# Patient Record
Sex: Female | Born: 1940 | Race: White | Hispanic: Yes | Marital: Married | Smoking: Never smoker
Health system: Southern US, Community
[De-identification: ages and names within clinical notes are randomized; demographics above are authoritative.]

## PROBLEM LIST (undated history)

## (undated) DIAGNOSIS — K219 Gastro-esophageal reflux disease without esophagitis: Secondary | ICD-10-CM

## (undated) DIAGNOSIS — I10 Essential (primary) hypertension: Secondary | ICD-10-CM

## (undated) DIAGNOSIS — M1990 Unspecified osteoarthritis, unspecified site: Secondary | ICD-10-CM

## (undated) HISTORY — PX: JOINT REPLACEMENT: SHX530

## (undated) HISTORY — PX: CHOLECYSTECTOMY: SHX55

## (undated) HISTORY — PX: TONSILLECTOMY: SUR1361

## (undated) HISTORY — PX: TRIGGER FINGER RELEASE: SHX641

## (undated) HISTORY — PX: WRIST FRACTURE SURGERY: SHX121

## (undated) HISTORY — PX: APPENDECTOMY: SHX54

## (undated) HISTORY — PX: ANKLE FRACTURE SURGERY: SHX122

## (undated) HISTORY — PX: EYE SURGERY: SHX253

## (undated) HISTORY — PX: OTHER SURGICAL HISTORY: SHX169

---

## 2006-08-13 ENCOUNTER — Emergency Department: Payer: Self-pay | Admitting: Emergency Medicine

## 2006-08-16 ENCOUNTER — Ambulatory Visit: Payer: Self-pay | Admitting: General Practice

## 2006-10-25 ENCOUNTER — Encounter: Payer: Self-pay | Admitting: General Practice

## 2006-11-08 ENCOUNTER — Encounter: Payer: Self-pay | Admitting: General Practice

## 2011-10-05 ENCOUNTER — Ambulatory Visit: Payer: Self-pay

## 2011-10-18 ENCOUNTER — Ambulatory Visit: Payer: Self-pay | Admitting: General Practice

## 2011-12-06 ENCOUNTER — Ambulatory Visit: Payer: Self-pay | Admitting: General Practice

## 2011-12-06 DIAGNOSIS — Z01810 Encounter for preprocedural cardiovascular examination: Secondary | ICD-10-CM

## 2011-12-06 LAB — URINALYSIS, COMPLETE
Bacteria: NONE SEEN
Blood: NEGATIVE
Leukocyte Esterase: NEGATIVE
Ph: 7 (ref 4.5–8.0)
Protein: NEGATIVE
Squamous Epithelial: NONE SEEN

## 2011-12-06 LAB — CBC
MCH: 32.2 pg (ref 26.0–34.0)
MCHC: 33.9 g/dL (ref 32.0–36.0)
MCV: 95 fL (ref 80–100)
Platelet: 276 10*3/uL (ref 150–440)
RBC: 4.24 10*6/uL (ref 3.80–5.20)

## 2011-12-06 LAB — PROTIME-INR: INR: 1

## 2011-12-06 LAB — BASIC METABOLIC PANEL
Anion Gap: 6 — ABNORMAL LOW (ref 7–16)
BUN: 22 mg/dL — ABNORMAL HIGH (ref 7–18)
Calcium, Total: 9.4 mg/dL (ref 8.5–10.1)
Chloride: 104 mmol/L (ref 98–107)
Co2: 32 mmol/L (ref 21–32)
Creatinine: 0.75 mg/dL (ref 0.60–1.30)
EGFR (African American): >60
Glucose: 102 mg/dL — ABNORMAL HIGH (ref 65–99)
Osmolality: 287 (ref 275–301)
Potassium: 4.5 mmol/L (ref 3.5–5.1)

## 2011-12-06 LAB — MRSA PCR SCREENING

## 2011-12-06 LAB — APTT: Activated PTT: 28.5 secs (ref 23.6–35.9)

## 2011-12-13 ENCOUNTER — Ambulatory Visit: Payer: Self-pay | Admitting: General Practice

## 2011-12-20 ENCOUNTER — Inpatient Hospital Stay: Payer: Self-pay | Admitting: General Practice

## 2011-12-21 LAB — BASIC METABOLIC PANEL
Anion Gap: 7 (ref 7–16)
Calcium, Total: 8.3 mg/dL — ABNORMAL LOW (ref 8.5–10.1)
Chloride: 104 mmol/L (ref 98–107)
Co2: 30 mmol/L (ref 21–32)
Creatinine: 0.69 mg/dL (ref 0.60–1.30)
EGFR (African American): >60
EGFR (Non-African Amer.): >60
Glucose: 101 mg/dL — ABNORMAL HIGH (ref 65–99)
Osmolality: 282 (ref 275–301)
Sodium: 141 mmol/L (ref 136–145)

## 2011-12-21 LAB — PLATELET COUNT: Platelet: 182 10*3/uL (ref 150–440)

## 2011-12-21 LAB — HEMOGLOBIN: HGB: 11.4 g/dL — ABNORMAL LOW (ref 12.0–16.0)

## 2011-12-22 LAB — BASIC METABOLIC PANEL
Anion Gap: 8 (ref 7–16)
BUN: 11 mg/dL (ref 7–18)
Calcium, Total: 8.6 mg/dL (ref 8.5–10.1)
Chloride: 102 mmol/L (ref 98–107)
Co2: 30 mmol/L (ref 21–32)
EGFR (African American): >60
EGFR (Non-African Amer.): >60
Glucose: 94 mg/dL (ref 65–99)
Osmolality: 279 (ref 275–301)
Potassium: 3.9 mmol/L (ref 3.5–5.1)

## 2011-12-22 LAB — HEMOGLOBIN: HGB: 11.1 g/dL — ABNORMAL LOW (ref 12.0–16.0)

## 2011-12-22 LAB — PLATELET COUNT: Platelet: 188 10*3/uL (ref 150–440)

## 2015-03-02 NOTE — Op Note (Signed)
PATIENT NAME:  Anne [name], Anne [name] MR#:  [id number] DATE OF BIRTH:  [birth year]  DATE OF PROCEDURE:  12/20/2011  PREOPERATIVE DIAGNOSIS: Degenerative arthrosis of the right knee.   POSTOPERATIVE DIAGNOSIS: Degenerative arthrosis of the right knee.   PROCEDURE PERFORMED: Right total knee arthroplasty using computer-assisted navigation.   SURGEON: Illene LabradorJames P. Angie FavaHooten Jr., MD  ASSISTANT: Van ClinesJon Wolfe, PA-C (required to maintain retraction throughout the procedure)   ANESTHESIA: Femoral nerve block and spinal.   ESTIMATED BLOOD LOSS: 50 mL.   FLUIDS REPLACED: 1300 mL of crystalloid.   TOURNIQUET TIME: 94 minutes.   DRAINS: Two medium drains to reinfusion system.   SOFT TISSUE RELEASES: Anterior cruciate ligament, posterior cruciate ligament, deep medial collateral ligament, and patellofemoral ligament.   IMPLANTS UTILIZED: DePuy PFC Sigma size 3 posterior stabilized femoral component (cemented), size 3 MBT tibial component (cemented), 32 mm three peg oval dome patella (cemented), and [name] 17.5 mm stabilized rotating platform polyethylene insert. Gentamicin cement was used due to patient's previous surgery to the right knee.   INDICATIONS FOR SURGERY: The patient is [name] 74 year old female who has been seen for complaints of progressive right knee pain. Patient has remote history of right proximal tibial osteotomy for degenerative arthrosis. She underwent staged procedure earlier with removal of the plate and screws. After adequate healing of that site, plans were made for right total knee arthroplasty. Risks and benefits of surgical intervention were discussed in detail with patient. She expressed her understanding of the risks and benefits and agreed with plans for surgical intervention.   PROCEDURE IN DETAIL: Patient was brought into the Operating Room and, after adequate femoral nerve block and spinal anesthesia was achieved, [name] tourniquet was placed on patient's upper right thigh. Patient's right knee and  leg were cleaned and prepped with alcohol and DuraPrep, draped in the usual sterile fashion. [name] "timeout" was performed as per usual protocol. The right lower extremity was exsanguinated using Esmarch, and the tourniquet was inflated to 300 mmHg. An anterior longitudinal incision was made incorporating the previous surgical incision. Standard mid vastus approach was utilized. The deep fibers of the medial collateral ligament were elevated in subperiosteal fashion off the medial flare of the tibia so as to maintain [name] continuous soft tissue sleeve. Patella was subluxed laterally and the patellofemoral ligament was incised. Inspection of the knee demonstrated severe degenerative changes most notably to the medial compartment. Prominent osteophytes were debrided using rongeur. Anterior and posterior cruciate ligaments were excised. Two 4.0 mm Schanz pins were inserted into the femur and into the tibia for attachment of the ray of spheres used for computer-assisted navigation. Hip center was identified using circumduction technique. Distal landmarks were mapped using computer. Distal femur and proximal tibia were mapped using the computer. Distal femoral cutting guide was positioned using computer-assisted navigation so as to achieve [name] 5 degrees distal valgus cut. Cut was performed and verified using computer. Distal femur was sized and it was felt that [name] size 3 femoral component was appropriate. Size 3 cutting guide was positioned using computer-assisted navigation and the anterior cut was performed and verified using the computer. This was followed by completion of the posterior and chamfer cuts. The femoral cutting guide for central box was then positioned and central box cut was performed.   Attention was then directed to the proximal tibia. Medial and lateral menisci were excised. The extramedullary tibial cutting guide was positioned using computer-assisted navigation so as to achieve 0 degrees varus valgus  alignment and 0 degrees posterior  slope. Cut was performed and verified using the computer. Proximal tibia was sized and it was felt that [name] size 3 tibial tray was appropriate. Tibial and femoral trials were inserted followed by insertion of first [name] 12, subsequently [name] 15 and eventually [name] 17.5 mm polyethylene trial. This allowed for good medial and lateral soft tissue balancing both in full extension and in 90 degrees of flexion. Finally, the patella was cut and prepared so as to accommodate [name] 32 mm three peg oval dome patella. Patellar trial was placed and the knee was placed through range of motion with excellent patellar tracking appreciated.   Femoral trial was removed. Central post hole for the tibial component was reamed followed by insertion of [name] keel punch. Tibial trial was then removed. Cut surfaces of bone were irrigated with copious amounts of normal saline with antibiotic solution using pulsatile lavage and then suctioned dry. Polymethyl methacrylate cement with gentamicin was prepared in the usual fashion using [name] vacuum mixer. Cement was applied to the cut surface of the proximal tibia as well as along the undersurface of [name] size 3 MBT tibial component. Tibial component was positioned and impacted into place. Excess cement was removed using freer elevators. Cement was then applied to the cut surface of the femur as well as along the posterior flanges of [name] size 3 posterior stabilized femoral component. Femoral component was positioned and impacted into place. Excess cement was removed using freer elevators. [name] 17.5 mm polyethylene trial was inserted and the knee was brought into full extension with steady axial compression applied. Finally, cement was applied to the backside of [name] 32 mm three peg oval dome patella and the patellar component was positioned and patellar clamp applied. Excess cement was removed using freer elevators.   After adequate curing of cement, tourniquet was deflated after total  tourniquet time of 94 minutes. Hemostasis was achieved using electrocautery. The knee was irrigated with copious amounts of normal saline with antibiotic solution using pulsatile lavage and then suctioned dry. The knee was inspected for any residual cement debris. 30 mL of 0.25% Marcaine with epinephrine was injected along the posterior capsule. [name] 17.5 mm stabilized rotating platform polyethylene insert was inserted and the knee was placed through [name] range of motion. Excellent mediolateral soft tissue balancing was appreciated both in full extension and in 30 degrees of flexion.   Two medium drains were placed in the wound bed and brought out through [name] separate stab incision to be attached to reinfusion system. Medial parapatellar portion of incision was reapproximated using interrupted sutures of #1 Vicryl. Subcutaneous tissue was approximated in layers using first #0 Vicryl followed by 2-0 Vicryl. Skin was closed with skin staples. Sterile dressing was applied.   Patient tolerated procedure well. She was transported to the recovery room in stable condition.   ____________________________ Illene Labrador. Angie Fava., MD jph:cms D: 12/20/2011 16:26:02 ET T: 12/20/2011 17:34:21 ET JOB#: [id number]  cc: Fayrene Fearing P. Angie Fava., MD, <Dictator> JAMES P Angie Fava MD ELECTRONICALLY SIGNED 12/20/2011 23:01

## 2015-03-02 NOTE — Discharge Summary (Signed)
PATIENT NAME:  Anne Anne [name], Anne Anne [name] MR#:  [id number] DATE OF BIRTH:  1941-03-17  DATE OF ADMISSION:  12/20/2011 DATE OF DISCHARGE:  12/23/2011  ADMITTING DIAGNOSIS: Degenerative arthrosis of the right knee.   DISCHARGE DIAGNOSES: Degenerative arthrosis of the right knee.   HISTORY: The patient is Anne [name] pleasant 74 year old female who has been [facility] for progression of right knee pain. She has had Anne [name] remote history of arthroscopy as well as Anne [name] right proximal tibial osteotomy performed in 2003. She has had progressive right knee pain over the course of the last several years. Her pain was noted to be aggravated with weight-bearing activities. She had reported some near giving way of the knee as well as some mild swelling. She had recently underwent removal of the hardware from the right knee in preparation for total knee arthroplasty. X-rays taken prior to that showed significant narrowing of the medial cartilage space with subchondral sclerosis. She was noted to have anterior slope of the tibia noted on the lateral view. Degenerative changes were also noted to the patellofemoral articulation. After discussion of the risks and benefits of surgical intervention, the patient expressed her understanding of the risks and benefits and agreed with plans for surgical intervention.   PROCEDURE: Right total knee arthroplasty using computer-assisted navigation.   ANESTHESIA: Femoral nerve block with spinal.   SOFT TISSUE RELEASE: Anterior cruciate ligament, posterior cruciate ligament, and deep medial collateral ligaments, as well as the patellofemoral ligament.   IMPLANTS UTILIZED: DePuy PFC Sigma size 3 posterior stabilized femoral component (cemented), size 3 MBT tibial component (cemented), 32 mm three pegged oval dome patella (cemented), and Anne [name] [name]   HOSPITAL  COURSE: The patient tolerated the procedure very well. She had no complications. She was then taken to the PAC-U where she was stabilized and then transferred to the orthopedic floor. The patient began receiving anticoagulation therapy of Lovenox 30 mg subcutaneous every 12 hours per anesthesia protocol. TED stockings were applied bilaterally. These were allowed to be removed one hour per eight hour shift. The right one was applied on day two following removal of the Hemovac and dressing change. The patient was also fitted with the AV-I compression foot pumps bilaterally set at 130 mmHg. Her calves have been nontender and free of any evidence of any deep venous thromboses. Negative Homans sign. Heels were elevated off the bed using rolled towels. The heels have been nontender and no tissue breakdown was noted.  The patient has denied any chest pain or shortness of breath. Vital signs have been stable. She has been afebrile. Hemodynamically she was stable. No transfusions were given other than the Autovac given the first six hours postoperatively.   Physical therapy was initiated on day one for gait training and transfers. She has done extremely well. She was ambulating greater than 200 feet upon discharge and was able to get up and down four sets of steps. She was independent with bed to chair transfers. Occupational therapy was also initiated for activities of daily living and assistive devices.   The patient's Hemovac, IV, and Foley were all discontinued on day two along with the dressing change. The wound was free of any drainage or signs of infection. Polar Care was reapplied to the surgical leg maintaining Anne [name] temperature of 40 to 50 degrees Fahrenheit.   DISPOSITION: The patient is being discharged to home in improved  stable condition.   DISCHARGE INSTRUCTIONS:  1. She is to continue using Anne [name] walker until cleared by physical therapy to go to Anne [name] quad cane. She may weight bear as tolerated.  2. Continue  with TED stockings bilaterally. These are allowed to be removed at night but is to wear these during the day.  3. Polar Care to the surgical leg maintaining Anne [name] temperature of 40 to 50 degrees Fahrenheit.  4. She was instructed on wound care. She will follow-up in the clinic in two weeks. No showers until that time.  5. She is placed on Anne [name] regular diet.  6. She will receive home health physical therapy. 7. She is to resume her regular medication that she was on prior to admission. She was given Anne [name] prescription for Ultram as well as Vicodin. Anne [name] prescription for Lovenox 40 mg subcutaneously daily for 14 days was given. She is to discontinue this after 14 days and begin taking one 81 mg enteric-coated aspirin. She is to call the clinic if she has any temperatures of 101.5 or greater or excessive bleeding.     PAST MEDICAL HISTORY:  1. Chickenpox.  2. Osteoporosis.  ____________________________ Van Clines, PA jrw:slb D: 12/23/2011 07:51:38 ET T: 12/23/2011 14:20:07 ET JOB#: [id number]  cc: Van Clines, PA, <Dictator> Bogdan Vivona PA ELECTRONICALLY SIGNED 12/26/2011 19:35

## 2019-06-22 ENCOUNTER — Emergency Department
Admission: EM | Admit: 2019-06-22 | Discharge: 2019-06-22 | Disposition: A | Discharge status: Home / Self Care | Payer: Medicare HMO | Attending: Student | Admitting: Student

## 2019-06-22 ENCOUNTER — Other Ambulatory Visit: Payer: Self-pay

## 2019-06-22 DIAGNOSIS — Z20822 Contact with and (suspected) exposure to covid-19: Secondary | ICD-10-CM

## 2019-06-22 DIAGNOSIS — R0981 Nasal congestion: Secondary | ICD-10-CM | POA: Diagnosis not present

## 2019-06-22 DIAGNOSIS — Z20828 Contact with and (suspected) exposure to other viral communicable diseases: Secondary | ICD-10-CM | POA: Diagnosis not present

## 2019-06-22 DIAGNOSIS — R05 Cough: Secondary | ICD-10-CM | POA: Insufficient documentation

## 2019-06-22 DIAGNOSIS — Z79899 Other long term (current) drug therapy: Secondary | ICD-10-CM | POA: Diagnosis not present

## 2019-06-22 LAB — SARS CORONAVIRUS 2 BY RT PCR (HOSPITAL ORDER, PERFORMED IN ~~LOC~~ HOSPITAL LAB): SARS Coronavirus 2: NEGATIVE

## 2019-06-22 NOTE — ED Triage Notes (Signed)
Patient presents to the ED with cough and runny nose x 2 days.  Patient is in no obvious distress at this time.

## 2019-06-22 NOTE — ED Notes (Signed)
See triage note  Presents with runny nose and slight cough for about 2 days   Denies any fever  States she is planning to go out of town this weekend    Provider in with pt on arrival

## 2019-06-22 NOTE — ED Provider Notes (Signed)
[facility] Emergency Department Provider Note  ____________________________________________   First MD Initiated Contact with Patient 06/22/19 1515     (approximate)  I have reviewed the triage vital signs and the nursing notes.   HISTORY  Chief Complaint Cough and Nasal Congestion    HPI Anne [name] is a 78 y.o. female presents emergency department complaining of cough and runny nose for 2 days.  No fever or chills.  Her daughter works in [employer] and they are concerned that she has [place].  She denies any loss of taste or smell.  No chest pain or shortness of breath at this time.  Positive language barrier.  Patient is living with a family member that is immunocompromise.    No past medical history on file.  There are no active problems to display for this patient.     Prior to Admission medications   Medication Sig Start Date End Date Taking? Authorizing Provider  citalopram (CELEXA) 20 MG tablet Take 20 mg by mouth daily.   Yes [provider]  LORazepam (ATIVAN) 0.5 MG tablet Take 0.5 mg by mouth every 8 (eight) hours.   Yes [provider]    Allergies Patient has no known allergies.  No family history on file.  Social History Social History   Tobacco Use  . Smoking status: Not on file  Substance Use Topics  . Alcohol use: Not on file  . Drug use: Not on file    Review of Systems  Constitutional: No fever/chills Eyes: No visual changes. ENT: No sore throat.  Positive runny nose and congestion Respiratory: Positive cough Genitourinary: Negative for dysuria. Musculoskeletal: Negative for back pain. Skin: Negative for rash.    ____________________________________________   PHYSICAL EXAM:  VITAL SIGNS: ED Triage Vitals  Enc Vitals Group     BP 06/22/19 1504 (!) 174/81     Pulse Rate 06/22/19 1504 80     Resp 06/22/19 1504 18     Temp 06/22/19 1504 98.8 F (37.1 C)     Temp Source 06/22/19  1504 Oral     SpO2 06/22/19 1504 97 %     Weight 06/22/19 1459 144 lb (65.3 kg)     Height 06/22/19 1459 5\' 5"  (1.651 m)     Head Circumference --      Peak Flow --      Pain Score 06/22/19 1500 4     Pain Loc --      Pain Edu? --      Excl. in [place]? --     Constitutional: Alert and oriented. Well appearing and in no acute distress. Eyes: Conjunctivae are normal.  Head: Atraumatic. Nose: Positive congestion/rhinnorhea. Mouth/Throat: Mucous membranes are moist.   Neck:  supple no lymphadenopathy noted Cardiovascular: Normal rate, regular rhythm. Heart sounds are normal Respiratory: Normal respiratory effort.  No retractions, lungs c t a  GU: deferred Musculoskeletal: FROM all extremities, warm and well perfused Neurologic:  Normal speech and language.  Skin:  Skin is warm, dry and intact. No rash noted. Psychiatric: Mood and affect are normal. Speech and behavior are normal.  ____________________________________________   LABS (all labs ordered are listed, but only abnormal results are displayed)  Labs Reviewed  SARS CORONAVIRUS 2 (HOSPITAL ORDER, [facility])   ____________________________________________   ____________________________________________  RADIOLOGY    ____________________________________________   PROCEDURES  Procedure(s) performed: No  Procedures    ____________________________________________   INITIAL IMPRESSION / [place] /  ED COURSE  Pertinent labs & imaging results that were available during my care of the patient were reviewed by me and considered in my medical decision making (see chart for details).   Patient 78 year old female presents emergency department with concerns of coded.   exam shows some rhinorrhea and a dry cough.  COVID test ordered  Patient would like for us to call her with the results  Patient was called through for results at 1710.  She is going to pick up the hard copy of  the result.  It was left at the front desk with her and her daughter's name on it.     Clinical Course as of Jun 21 1709  Fri Jun 22, 2019  1702 SARS Coronavirus 2: NEGATIVE [SF]    Clinical Course User Index [SF] Faythe GheeFisher, Yoan Sallade W, PA-C   Anne [name] was evaluated in Emergency Department on 06/22/2019 for the symptoms described in the history of present illness. She was evaluated in the context of the global COVID-19 pandemic, which necessitated consideration that the patient might be at risk for infection with the SARS-CoV-2 virus that causes COVID-19. Institutional protocols and algorithms that pertain to the evaluation of patients at risk for COVID-19 are in a state of rapid change based on information released by regulatory bodies including the CDC and federal and state organizations. These policies and algorithms were followed during the patient's care in the ED.   As part of my medical decision making, I reviewed the following data within the electronic MEDICAL RECORD NUMBER Nursing notes reviewed and incorporated, Labs reviewed COVID-19 test is negative, Notes from prior ED visits and [place] Controlled Substance Database  ____________________________________________   FINAL CLINICAL IMPRESSION(S) / ED DIAGNOSES  Final diagnoses:  Suspected Covid-19 Virus Infection      NEW MEDICATIONS STARTED DURING THIS VISIT:  Discharge Medication List as of 06/22/2019  3:28 PM       Note:  This document was prepared using Dragon voice recognition software and may include unintentional dictation errors.    Faythe GheeFisher, Samaiya Awadallah W, PA-C 06/22/19 1710    Miguel AschoffMonks, Sarah L., MD 06/23/19 [phone]

## 2019-06-22 NOTE — Discharge Instructions (Signed)
Follow-up with regular doctor as needed.  Return emergency department as needed.  We will call you with your test results later today.

## 2020-04-16 ENCOUNTER — Other Ambulatory Visit (HOSPITAL_COMMUNITY): Payer: Self-pay | Admitting: Unknown Physician Specialty

## 2020-04-16 ENCOUNTER — Other Ambulatory Visit: Payer: Self-pay | Admitting: Unknown Physician Specialty

## 2020-04-16 DIAGNOSIS — K1123 Chronic sialoadenitis: Secondary | ICD-10-CM

## 2020-04-23 ENCOUNTER — Other Ambulatory Visit: Payer: Self-pay

## 2020-04-23 ENCOUNTER — Ambulatory Visit
Admission: RE | Admit: 2020-04-23 | Discharge: 2020-04-23 | Disposition: A | Discharge status: Home / Self Care | Payer: Medicare HMO | Source: Ambulatory Visit | Attending: Unknown Physician Specialty | Admitting: Unknown Physician Specialty

## 2020-04-23 DIAGNOSIS — K1123 Chronic sialoadenitis: Secondary | ICD-10-CM | POA: Insufficient documentation

## 2020-04-23 LAB — POCT I-STAT CREATININE: Creatinine, Ser: 0.8 mg/dL (ref 0.44–1.00)

## 2020-04-23 MED ORDER — IOHEXOL 300 MG/ML  SOLN
75.0000 mL | Freq: Once | INTRAMUSCULAR | Status: AC | PRN
  Administered 2020-04-23: 75 mL via INTRAVENOUS

## 2020-04-29 ENCOUNTER — Other Ambulatory Visit: Payer: Self-pay | Admitting: Unknown Physician Specialty

## 2020-04-29 DIAGNOSIS — E041 Nontoxic single thyroid nodule: Secondary | ICD-10-CM

## 2021-01-23 ENCOUNTER — Other Ambulatory Visit: Payer: Self-pay | Admitting: Orthopedic Surgery

## 2021-01-23 DIAGNOSIS — M5416 Radiculopathy, lumbar region: Secondary | ICD-10-CM

## 2021-02-03 ENCOUNTER — Ambulatory Visit
Admission: RE | Admit: 2021-02-03 | Discharge: 2021-02-03 | Disposition: A | Discharge status: Home / Self Care | Payer: Medicare HMO | Source: Ambulatory Visit | Attending: Orthopedic Surgery | Admitting: Orthopedic Surgery

## 2021-02-03 ENCOUNTER — Other Ambulatory Visit: Payer: Self-pay

## 2021-02-03 DIAGNOSIS — M5416 Radiculopathy, lumbar region: Secondary | ICD-10-CM | POA: Insufficient documentation

## 2021-03-31 ENCOUNTER — Other Ambulatory Visit: Payer: Self-pay | Admitting: Neurosurgery

## 2021-04-08 DIAGNOSIS — M5416 Radiculopathy, lumbar region: Secondary | ICD-10-CM

## 2021-04-08 HISTORY — DX: Radiculopathy, lumbar region: M54.16

## 2021-04-14 ENCOUNTER — Encounter
Admission: RE | Admit: 2021-04-14 | Discharge: 2021-04-14 | Disposition: A | Discharge status: Home / Self Care | Payer: Medicare HMO | Source: Ambulatory Visit | Attending: Neurosurgery | Admitting: Neurosurgery

## 2021-04-14 ENCOUNTER — Other Ambulatory Visit: Payer: Self-pay

## 2021-04-14 DIAGNOSIS — Z01818 Encounter for other preprocedural examination: Secondary | ICD-10-CM | POA: Diagnosis present

## 2021-04-14 DIAGNOSIS — I10 Essential (primary) hypertension: Secondary | ICD-10-CM | POA: Insufficient documentation

## 2021-04-14 HISTORY — DX: Essential (primary) hypertension: I10

## 2021-04-14 HISTORY — DX: Unspecified osteoarthritis, unspecified site: M19.90

## 2021-04-14 LAB — CBC
HCT: 39.4 % (ref 36.0–46.0)
Hemoglobin: 13.5 g/dL (ref 12.0–15.0)
MCH: 33 pg (ref 26.0–34.0)
MCHC: 34.3 g/dL (ref 30.0–36.0)
MCV: 96.3 fL (ref 80.0–100.0)
Platelets: 261 10*3/uL (ref 150–400)
RBC: 4.09 MIL/uL (ref 3.87–5.11)
RDW: 12.3 % (ref 11.5–15.5)
WBC: 4.4 10*3/uL (ref 4.0–10.5)
nRBC: 0 % (ref 0.0–0.2)

## 2021-04-14 LAB — URINALYSIS, ROUTINE W REFLEX MICROSCOPIC
Bilirubin Urine: NEGATIVE
Glucose, UA: NEGATIVE mg/dL
Hgb urine dipstick: NEGATIVE
Ketones, ur: NEGATIVE mg/dL
Leukocytes,Ua: NEGATIVE
Nitrite: NEGATIVE
Protein, ur: NEGATIVE mg/dL
Specific Gravity, Urine: 1.011 (ref 1.005–1.030)
pH: 6 (ref 5.0–8.0)

## 2021-04-14 LAB — BASIC METABOLIC PANEL
Anion gap: 12 (ref 5–15)
BUN: 14 mg/dL (ref 8–23)
CO2: 24 mmol/L (ref 22–32)
Calcium: 8.9 mg/dL (ref 8.9–10.3)
Chloride: 102 mmol/L (ref 98–111)
Creatinine, Ser: 0.59 mg/dL (ref 0.44–1.00)
GFR, Estimated: >60 mL/min (ref >60)
Glucose, Bld: 97 mg/dL (ref 70–99)
Potassium: 3.8 mmol/L (ref 3.5–5.1)
Sodium: 138 mmol/L (ref 135–145)

## 2021-04-14 LAB — PROTIME-INR
INR: 1 (ref 0.8–1.2)
Prothrombin Time: 13.6 seconds (ref 11.4–15.2)

## 2021-04-14 LAB — SURGICAL PCR SCREEN
MRSA, PCR: NEGATIVE
Staphylococcus aureus: NEGATIVE

## 2021-04-14 LAB — TYPE AND SCREEN
ABO/RH(D): A POS
Antibody Screen: NEGATIVE

## 2021-04-14 LAB — APTT: aPTT: 27 seconds (ref 24–36)

## 2021-04-14 NOTE — Patient Instructions (Addendum)
INSTRUCTIONS FOR SURGERY     Your surgery is scheduled for: Wednesday, June 15TH       To find out your arrival time for the day of surgery,          please call [phone] between 1 pm and 3 pm on : Tuesday, June 14TH     When you arrive for surgery, report to the REGISTRATION DESK ON THE FIRST FLOOR OF THE MEDICAL MALL. ONCE THEY HAVE COMPLETED THEIR PROCESS, PROCEED TO THE SECOND FLOOR AND SIGN IN AT THE SURGERY DESK.    REMEMBER: Instructions that are not followed completely may result in serious medical risk,  up to and including death, or upon the discretion of your surgeon and anesthesiologist,            your surgery may need to be rescheduled.  __X__ 1. Do not eat food after midnight the night before your procedure.                    No gum, candy, lozenger, tic tacs, tums or hard candies.                  ABSOLUTELY NOTHING SOLID IN YOUR MOUTH AFTER MIDNIGHT                    You may drink unlimited clear liquids up to 2 hours before you are scheduled to arrive for surgery.                   Do not drink anything within those 2 hours unless you need to take medicine, then take the                   smallest amount you need.  Clear liquids include:  water, apple juice without pulp,                   any flavor Gatorade, Black coffee, black tea.  Sugar may be added but no dairy/ honey /lemon.                        Broth and jello is not considered a clear liquid.  __x__  2. On the morning of surgery, please brush your teeth with toothpaste and water. You may rinse with                  mouthwash if you wish but DO NOT SWALLOW TOOTHPASTE OR MOUTHWASH  __X___3. NO alcohol for 24 hours before or after surgery.  __x___ 4.  Do NOT smoke or use e-cigarettes for 24 HOURS PRIOR TO SURGERY.                      DO NOT Use any chewable tobacco products for at least 6 hours prior to surgery.  __x___ 5. If you start any new  medication after this appointment and prior to surgery, please                   Bring it with you on the day of surgery.  ___x__ 6. Notify your doctor  if there is any change in your medical condition, such as fever,                  infection, vomitting, diarrhea or any open sores.  __x___ 7.  USE the CHG SOAP as instructed, the night before surgery and the day of surgery.                   Once you have washed with this soap, do NOT use any of the following: Powders, perfumes                    or lotions. Please do not wear make up, hairpins, clips or nail polish. You may wear deodorant.                                                    Women need to shave 48 hours prior to surgery.                   DO NOT wear ANY jewelry on the day of surgery. If there are rings that are too tight to                    remove easily, please address this prior to the surgery day. Piercings need to be removed.                                                                     NO METAL ON YOUR BODY.                    Do NOT bring any valuables.  If you came to Pre-Admit testing then you will not need license,                     insurance card or credit card.  If you will be staying overnight, please either leave your things in                     the car or have your family be responsible for these items.                     [facility] IS NOT RESPONSIBLE FOR BELONGINGS OR VALUABLES.  ___X__ 8. DO NOT wear contact lenses on surgery day.  You may not have dentures,                     Hearing aides, contacts or glasses in the operating room. These items can be                    Placed in the Recovery Room to receive immediately after surgery.  __x___ 9. IF YOU ARE SCHEDULED TO GO HOME ON THE SAME DAY, YOU MUST                   Have someone to drive you home and to stay with you  for the first 24 hours.  Have an arrangement prior to arriving on surgery day.  ___x__ 10. Take the  following medications on the morning of surgery with a sip of water:                              1. AMLODIPINE                     2. CELEXA                     3. OCUPRESS DROPS                     4. NORCO, if needed in the morning                     5.                     6.  __X__  12. STOP ALL ASPIRIN PRODUCTS ONE WEEK PRIOR TO SURGERY.                       THIS INCLUDES BC POWDERS / GOODIES POWDER  __x___ 13. STOP Anti-inflammatories as of ONE WEEK PRIOR TO SURGERY.                      This includes IBUPROFEN / MOTRIN / ADVIL / ALEVE/ NAPROXYN                           / CELEBREX                     YOU MAY TAKE TYLENOL ANY TIME PRIOR TO SURGERY.  _X____ 14.  Stop supplements until after surgery.                     This includes: B-COMPLEX // VITAMIN D3 // MAGNESIUM // MULTIVITAMINS //                        FISH OIL // THEANINE // CENTRUM SILVER //VITAMIN B12 // AMINO ACIDS   ___X___18. If staying overnight, please have appropriate shoes to wear to be able to walk around the unit.                   Wear clean and comfortable clothing to the hospital.  BRING CELL PHONE AND CHARGER. HAVE CELL PHONE NUMBERS FOR YOUR CONTACTS.  PLEASE BRING YOUR POWER OF ATTORNEY AND LIVING WILL SO WE CAN   MAKE A COPY OF IT.

## 2021-04-20 ENCOUNTER — Other Ambulatory Visit
Admission: RE | Admit: 2021-04-20 | Discharge: 2021-04-20 | Disposition: A | Discharge status: Home / Self Care | Payer: Medicare HMO | Source: Ambulatory Visit | Attending: Neurosurgery | Admitting: Neurosurgery

## 2021-04-20 ENCOUNTER — Other Ambulatory Visit: Payer: Self-pay

## 2021-04-20 DIAGNOSIS — Z01812 Encounter for preprocedural laboratory examination: Secondary | ICD-10-CM | POA: Diagnosis present

## 2021-04-20 DIAGNOSIS — Z20822 Contact with and (suspected) exposure to covid-19: Secondary | ICD-10-CM | POA: Diagnosis not present

## 2021-04-20 LAB — SARS CORONAVIRUS 2 (TAT 6-24 HRS): SARS Coronavirus 2: NEGATIVE

## 2021-04-22 ENCOUNTER — Ambulatory Visit: Payer: Medicare HMO | Admitting: Urgent Care

## 2021-04-22 ENCOUNTER — Observation Stay
Admission: RE | Admit: 2021-04-22 | Discharge: 2021-04-23 | Disposition: A | Discharge status: Home / Self Care | Payer: Medicare HMO | Attending: Neurosurgery | Admitting: Neurosurgery

## 2021-04-22 ENCOUNTER — Encounter
Admission: RE | Disposition: A | Discharge status: Home / Self Care | Payer: Self-pay | Source: Home / Self Care | Attending: Neurosurgery

## 2021-04-22 ENCOUNTER — Other Ambulatory Visit: Payer: Self-pay

## 2021-04-22 ENCOUNTER — Encounter: Payer: Self-pay | Admitting: Neurosurgery

## 2021-04-22 ENCOUNTER — Ambulatory Visit: Payer: Medicare HMO

## 2021-04-22 DIAGNOSIS — Z96653 Presence of artificial knee joint, bilateral: Secondary | ICD-10-CM | POA: Diagnosis not present

## 2021-04-22 DIAGNOSIS — M48061 Spinal stenosis, lumbar region without neurogenic claudication: Secondary | ICD-10-CM | POA: Insufficient documentation

## 2021-04-22 DIAGNOSIS — I10 Essential (primary) hypertension: Secondary | ICD-10-CM | POA: Diagnosis not present

## 2021-04-22 DIAGNOSIS — M5116 Intervertebral disc disorders with radiculopathy, lumbar region: Secondary | ICD-10-CM | POA: Diagnosis present

## 2021-04-22 DIAGNOSIS — Z79899 Other long term (current) drug therapy: Secondary | ICD-10-CM | POA: Diagnosis not present

## 2021-04-22 DIAGNOSIS — Z419 Encounter for procedure for purposes other than remedying health state, unspecified: Secondary | ICD-10-CM

## 2021-04-22 DIAGNOSIS — M5416 Radiculopathy, lumbar region: Secondary | ICD-10-CM | POA: Diagnosis present

## 2021-04-22 HISTORY — PX: LUMBAR LAMINECTOMY/DECOMPRESSION MICRODISCECTOMY: SHX5026

## 2021-04-22 LAB — ABO/RH: ABO/RH(D): A POS

## 2021-04-22 SURGERY — LUMBAR LAMINECTOMY/DECOMPRESSION MICRODISCECTOMY 3 LEVELS
Anesthesia: General | Laterality: Left

## 2021-04-22 MED ORDER — CEFAZOLIN SODIUM-DEXTROSE 2-4 GM/100ML-% IV SOLN
2.0000 g | INTRAVENOUS | Status: AC
  Administered 2021-04-22: 2 g via INTRAVENOUS

## 2021-04-22 MED ORDER — REMIFENTANIL HCL 1 MG IV SOLR
INTRAVENOUS | Status: DC | PRN
  Administered 2021-04-22: .4 ug/kg/min via INTRAVENOUS

## 2021-04-22 MED ORDER — EPHEDRINE SULFATE 50 MG/ML IJ SOLN
INTRAMUSCULAR | Status: DC | PRN
  Administered 2021-04-22 (×2): 10 mg via INTRAVENOUS

## 2021-04-22 MED ORDER — SODIUM CHLORIDE 0.9 % IV SOLN
INTRAVENOUS | Status: DC | PRN
  Administered 2021-04-22: 40 mL

## 2021-04-22 MED ORDER — HEMOSTATIC AGENTS (NO CHARGE) OPTIME
TOPICAL | Status: DC | PRN
  Administered 2021-04-22: 1 via TOPICAL

## 2021-04-22 MED ORDER — METHOCARBAMOL 1000 MG/10ML IJ SOLN
500.0000 mg | Freq: Four times a day (QID) | INTRAVENOUS | Status: DC | PRN
  Filled 2021-04-22: qty 5

## 2021-04-22 MED ORDER — CITALOPRAM HYDROBROMIDE 10 MG PO TABS
10.0000 mg | ORAL_TABLET | Freq: Every day | ORAL | Status: DC
  Filled 2021-04-22 (×2): qty 1

## 2021-04-22 MED ORDER — FENTANYL CITRATE (PF) 100 MCG/2ML IJ SOLN
INTRAMUSCULAR | Status: AC
  Filled 2021-04-22: qty 2

## 2021-04-22 MED ORDER — SODIUM CHLORIDE 0.9 % IV SOLN
INTRAVENOUS | Status: DC | PRN
  Administered 2021-04-22: 10 ug/min via INTRAVENOUS

## 2021-04-22 MED ORDER — LACTATED RINGERS IV SOLN: INTRAVENOUS | Status: DC

## 2021-04-22 MED ORDER — THEANINE 200 MG PO CAPS: 200.0000 mg | ORAL_CAPSULE | Freq: Every day | ORAL | Status: DC

## 2021-04-22 MED ORDER — REMIFENTANIL HCL 1 MG IV SOLR
INTRAVENOUS | Status: AC
  Filled 2021-04-22: qty 1000

## 2021-04-22 MED ORDER — LIDOCAINE HCL (CARDIAC) PF 100 MG/5ML IV SOSY
PREFILLED_SYRINGE | INTRAVENOUS | Status: DC | PRN
  Administered 2021-04-22: 100 mg via INTRAVENOUS

## 2021-04-22 MED ORDER — CELECOXIB 100 MG PO CAPS
100.0000 mg | ORAL_CAPSULE | Freq: Two times a day (BID) | ORAL | Status: DC
  Administered 2021-04-22: 100 mg via ORAL
  Filled 2021-04-22 (×4): qty 1

## 2021-04-22 MED ORDER — ONDANSETRON HCL 4 MG/2ML IJ SOLN
INTRAMUSCULAR | Status: DC | PRN
  Administered 2021-04-22: 4 mg via INTRAVENOUS

## 2021-04-22 MED ORDER — FENTANYL CITRATE (PF) 100 MCG/2ML IJ SOLN
INTRAMUSCULAR | Status: DC | PRN
  Administered 2021-04-22 (×2): 50 ug via INTRAVENOUS

## 2021-04-22 MED ORDER — BUPIVACAINE HCL (PF) 0.5 % IJ SOLN
INTRAMUSCULAR | Status: DC | PRN
  Administered 2021-04-22: 20 mL

## 2021-04-22 MED ORDER — ONDANSETRON HCL 4 MG/2ML IJ SOLN: 4.0000 mg | Freq: Four times a day (QID) | INTRAMUSCULAR | Status: DC | PRN

## 2021-04-22 MED ORDER — ADULT MULTIVITAMIN W/MINERALS CH
ORAL_TABLET | Freq: Every day | ORAL | Status: DC
  Filled 2021-04-22: qty 1

## 2021-04-22 MED ORDER — TIMOLOL MALEATE 0.5 % OP SOLN
1.0000 [drp] | Freq: Two times a day (BID) | OPHTHALMIC | Status: DC
  Administered 2021-04-22: 1 [drp] via OPHTHALMIC
  Filled 2021-04-22: qty 5

## 2021-04-22 MED ORDER — FIBRIN SEALANT 2 ML SINGLE DOSE KIT
PACK | CUTANEOUS | Status: DC | PRN
  Administered 2021-04-22: 2 mL via TOPICAL

## 2021-04-22 MED ORDER — POTASSIUM CHLORIDE IN NACL 20-0.9 MEQ/L-% IV SOLN
INTRAVENOUS | Status: DC
  Filled 2021-04-22 (×4): qty 1000

## 2021-04-22 MED ORDER — SENNOSIDES-DOCUSATE SODIUM 8.6-50 MG PO TABS: 1.0000 | ORAL_TABLET | Freq: Every evening | ORAL | Status: DC | PRN

## 2021-04-22 MED ORDER — GLYCOPYRROLATE 0.2 MG/ML IJ SOLN
INTRAMUSCULAR | Status: DC | PRN
  Administered 2021-04-22: .2 mg via INTRAVENOUS

## 2021-04-22 MED ORDER — BUPIVACAINE-EPINEPHRINE (PF) 0.5% -1:200000 IJ SOLN
INTRAMUSCULAR | Status: DC | PRN
  Administered 2021-04-22: 10 mL

## 2021-04-22 MED ORDER — FAMOTIDINE 20 MG PO TABS: 20.0000 mg | ORAL_TABLET | Freq: Once | ORAL | Status: AC

## 2021-04-22 MED ORDER — SODIUM CHLORIDE 0.9% FLUSH
3.0000 mL | Freq: Two times a day (BID) | INTRAVENOUS | Status: DC
  Administered 2021-04-22 – 2021-04-23 (×3): 3 mL via INTRAVENOUS

## 2021-04-22 MED ORDER — CEFAZOLIN SODIUM-DEXTROSE 2-4 GM/100ML-% IV SOLN
INTRAVENOUS | Status: AC
  Filled 2021-04-22: qty 100

## 2021-04-22 MED ORDER — FAMOTIDINE 20 MG PO TABS
ORAL_TABLET | ORAL | Status: AC
  Administered 2021-04-22: 20 mg via ORAL
  Filled 2021-04-22: qty 1

## 2021-04-22 MED ORDER — HYDROCODONE-ACETAMINOPHEN 5-325 MG PO TABS
1.0000 | ORAL_TABLET | ORAL | Status: DC | PRN
  Administered 2021-04-22: 1 via ORAL
  Filled 2021-04-22 (×2): qty 1

## 2021-04-22 MED ORDER — ORAL CARE MOUTH RINSE: 15.0000 mL | Freq: Once | OROMUCOSAL | Status: AC

## 2021-04-22 MED ORDER — PHENYLEPHRINE HCL (PRESSORS) 10 MG/ML IV SOLN
INTRAVENOUS | Status: DC | PRN
  Administered 2021-04-22: 100 ug via INTRAVENOUS

## 2021-04-22 MED ORDER — CHLORHEXIDINE GLUCONATE 0.12 % MT SOLN
OROMUCOSAL | Status: AC
  Administered 2021-04-22: 15 mL via OROMUCOSAL
  Filled 2021-04-22: qty 15

## 2021-04-22 MED ORDER — ONDANSETRON HCL 4 MG/2ML IJ SOLN: 4.0000 mg | Freq: Once | INTRAMUSCULAR | Status: DC | PRN

## 2021-04-22 MED ORDER — HYDROCODONE-ACETAMINOPHEN 5-325 MG PO TABS
2.0000 | ORAL_TABLET | ORAL | Status: DC | PRN
  Administered 2021-04-22: 1 via ORAL

## 2021-04-22 MED ORDER — METHOCARBAMOL 500 MG PO TABS
500.0000 mg | ORAL_TABLET | Freq: Four times a day (QID) | ORAL | Status: DC | PRN
  Administered 2021-04-22: 500 mg via ORAL
  Filled 2021-04-22: qty 1

## 2021-04-22 MED ORDER — SODIUM CHLORIDE 0.9% FLUSH: 3.0000 mL | INTRAVENOUS | Status: DC | PRN

## 2021-04-22 MED ORDER — THROMBIN 5000 UNITS EX SOLR
CUTANEOUS | Status: DC | PRN
  Administered 2021-04-22: 5000 [IU] via TOPICAL

## 2021-04-22 MED ORDER — METHYLPREDNISOLONE ACETATE 40 MG/ML IJ SUSP
INTRAMUSCULAR | Status: DC | PRN
  Administered 2021-04-22: 40 mg

## 2021-04-22 MED ORDER — AMLODIPINE BESYLATE 5 MG PO TABS
5.0000 mg | ORAL_TABLET | Freq: Every day | ORAL | Status: DC
  Filled 2021-04-22: qty 1

## 2021-04-22 MED ORDER — SUCCINYLCHOLINE CHLORIDE 20 MG/ML IJ SOLN
INTRAMUSCULAR | Status: DC | PRN
  Administered 2021-04-22: 100 mg via INTRAVENOUS

## 2021-04-22 MED ORDER — MELATONIN 5 MG PO TABS: 5.0000 mg | ORAL_TABLET | Freq: Every day | ORAL | Status: DC

## 2021-04-22 MED ORDER — SODIUM CHLORIDE 0.9 % IV SOLN: 250.0000 mL | INTRAVENOUS | Status: DC

## 2021-04-22 MED ORDER — PHENOL 1.4 % MT LIQD
1.0000 | OROMUCOSAL | Status: DC | PRN
  Administered 2021-04-22: 1 via OROMUCOSAL
  Filled 2021-04-22 (×2): qty 177

## 2021-04-22 MED ORDER — ONDANSETRON HCL 4 MG PO TABS: 4.0000 mg | ORAL_TABLET | Freq: Four times a day (QID) | ORAL | Status: DC | PRN

## 2021-04-22 MED ORDER — PROPOFOL 10 MG/ML IV BOLUS
INTRAVENOUS | Status: DC | PRN
  Administered 2021-04-22: 130 mg via INTRAVENOUS
  Administered 2021-04-22: 30 mg via INTRAVENOUS

## 2021-04-22 MED ORDER — SODIUM CHLORIDE 0.9 % IR SOLN
Status: DC | PRN
  Administered 2021-04-22: 1000 mL

## 2021-04-22 MED ORDER — ACETAMINOPHEN 650 MG RE SUPP: 650.0000 mg | RECTAL | Status: DC | PRN

## 2021-04-22 MED ORDER — FENTANYL CITRATE (PF) 100 MCG/2ML IJ SOLN
25.0000 ug | INTRAMUSCULAR | Status: DC | PRN
Start: 2021-04-22 — End: 2021-04-22
  Administered 2021-04-22: 25 ug via INTRAVENOUS

## 2021-04-22 MED ORDER — CHLORHEXIDINE GLUCONATE 0.12 % MT SOLN: 15.0000 mL | Freq: Once | OROMUCOSAL | Status: AC

## 2021-04-22 MED ORDER — ACETAMINOPHEN 325 MG PO TABS: 650.0000 mg | ORAL_TABLET | ORAL | Status: DC | PRN

## 2021-04-22 MED ORDER — MENTHOL 3 MG MT LOZG
1.0000 | LOZENGE | OROMUCOSAL | Status: DC | PRN
  Filled 2021-04-22: qty 9

## 2021-04-22 SURGICAL SUPPLY — 52 items
BUR NEURO DRILL SOFT 3.0X3.8M (BURR) ×3 IMPLANT
CHLORAPREP W/TINT 26 (MISCELLANEOUS) ×6 IMPLANT
CNTNR SPEC 2.5X3XGRAD LEK (MISCELLANEOUS) ×1
CONT SPEC 4OZ STER OR WHT (MISCELLANEOUS) ×2
CONTAINER SPEC 2.5X3XGRAD LEK (MISCELLANEOUS) ×1 IMPLANT
COUNTER NEEDLE 20/40 LG (NEEDLE) ×3 IMPLANT
COVER WAND RF STERILE (DRAPES) ×3 IMPLANT
CUP MEDICINE 2OZ PLAST GRAD ST (MISCELLANEOUS) ×6 IMPLANT
DERMABOND ADVANCED (GAUZE/BANDAGES/DRESSINGS) ×2
DERMABOND ADVANCED .7 DNX12 (GAUZE/BANDAGES/DRESSINGS) ×1 IMPLANT
DRAPE C ARM PK CFD 31 SPINE (DRAPES) ×3 IMPLANT
DRAPE LAPAROTOMY 100X77 ABD (DRAPES) ×3 IMPLANT
DRAPE MICROSCOPE SPINE 48X150 (DRAPES) ×3 IMPLANT
DRAPE SURG 17X11 SM STRL (DRAPES) ×12 IMPLANT
DRSG OPSITE POSTOP 4X6 (GAUZE/BANDAGES/DRESSINGS) ×3 IMPLANT
ELECT CAUTERY BLADE TIP 2.5 (TIP) ×3
ELECT EZSTD 165MM 6.5IN (MISCELLANEOUS) ×3
ELECTRODE CAUTERY BLDE TIP 2.5 (TIP) ×1 IMPLANT
ELECTRODE EZSTD 165MM 6.5IN (MISCELLANEOUS) ×1 IMPLANT
GAUZE XEROFORM 1X8 LF (GAUZE/BANDAGES/DRESSINGS) ×3 IMPLANT
GLOVE SURG SYN 8.5  E (GLOVE) ×6
GLOVE SURG SYN 8.5 E (GLOVE) ×3 IMPLANT
GOWN SRG XL LVL 3 NONREINFORCE (GOWNS) ×1 IMPLANT
GOWN STRL NON-REIN TWL XL LVL3 (GOWNS) ×2
GOWN STRL REUS W/ TWL XL LVL3 (GOWN DISPOSABLE) ×1 IMPLANT
GOWN STRL REUS W/TWL XL LVL3 (GOWN DISPOSABLE) ×2
GRADUATE 1200CC STRL 31836 (MISCELLANEOUS) ×3 IMPLANT
GRAFT DURAGEN MATRIX 1WX1L (Tissue) ×3 IMPLANT
KIT SPINAL PRONEVIEW (KITS) ×3 IMPLANT
KNIFE BAYONET SHORT DISCETOMY (MISCELLANEOUS) IMPLANT
MANIFOLD NEPTUNE II (INSTRUMENTS) ×3 IMPLANT
MARKER SKIN DUAL TIP RULER LAB (MISCELLANEOUS) ×3 IMPLANT
NDL SAFETY ECLIPSE 18X1.5 (NEEDLE) IMPLANT
NEEDLE HYPO 18GX1.5 SHARP (NEEDLE)
NEEDLE HYPO 22GX1.5 SAFETY (NEEDLE) ×3 IMPLANT
NS IRRIG 1000ML POUR BTL (IV SOLUTION) ×3 IMPLANT
PACK LAMINECTOMY NEURO (CUSTOM PROCEDURE TRAY) ×3 IMPLANT
PAD ARMBOARD 7.5X6 YLW CONV (MISCELLANEOUS) ×3 IMPLANT
SPOGE SURGIFLO 8M (HEMOSTASIS) ×2
SPONGE SURGIFLO 8M (HEMOSTASIS) ×1 IMPLANT
SUT DVC VLOC 3-0 CL 6 P-12 (SUTURE) ×3 IMPLANT
SUT ETHILON 3-0 FS-10 30 BLK (SUTURE) ×3
SUT VIC AB 0 CT1 27 (SUTURE) ×2
SUT VIC AB 0 CT1 27XCR 8 STRN (SUTURE) ×1 IMPLANT
SUT VIC AB 2-0 CT1 18 (SUTURE) ×6 IMPLANT
SUTURE EHLN 3-0 FS-10 30 BLK (SUTURE) ×1 IMPLANT
SYR 20ML LL LF (SYRINGE) ×3 IMPLANT
SYR 30ML LL (SYRINGE) ×6 IMPLANT
SYR 3ML LL SCALE MARK (SYRINGE) ×3 IMPLANT
TOWEL OR 17X26 4PK STRL BLUE (TOWEL DISPOSABLE) ×9 IMPLANT
TUBING CONNECTING 10 (TUBING) ×2 IMPLANT
TUBING CONNECTING 10' (TUBING) ×1

## 2021-04-22 NOTE — Op Note (Signed)
Indications: [name] is a 80 yo female who presented with lumbar radiculopathy.  She failed conservative management prompting surgical intervention.  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   Preoperative Note:   Risks of surgery discussed include: infection, bleeding, stroke, coma, death, paralysis, CSF leak, nerve/spinal cord injury, numbness, tingling, weakness, complex regional pain syndrome, recurrent stenosis and/or disc herniation, vascular injury, development of instability, neck/back pain, need for further surgery, persistent symptoms, development of deformity, and the risks of anesthesia. The patient understood these risks and agreed to proceed.  Operative Note:   1. L4-S1 lumbar decompression with left laminoforaminotomies 2. Left L3-4 microdiscectomy  The patient was then brought from the preoperative center with intravenous access established.  The patient underwent general anesthesia and endotracheal tube intubation, and was then rotated on the [place] rail top where all pressure points were appropriately padded.  The skin was then thoroughly cleansed.  Perioperative antibiotic prophylaxis was administered.  Sterile prep and drapes were then applied and a timeout was then observed.  C-arm was brought into the field under sterile conditions and under lateral visualization the L5-S1 interspace was identified and marked.  The incision was marked in the midline from L3-4 to L5-S1 and injected with local anesthetic. Once this was complete a 6 cm incision was opened with the use of a #10 blade knife.    The metrx tubes were sequentially advanced and confirmed in position at L5-S1. An 38mm by 60mm tube was locked in place to the bed side  attachment.  The microscope was then sterilely brought into the field and muscle creep was hemostased with a bipolar and resected with a pituitary rongeur.  A Bovie extender was then used to expose the spinous process and lamina.  Careful attention was placed to not violate the facet capsule. A 3 mm matchstick drill bit was then used to make a hemi-laminotomy trough until the ligamentum flavum was exposed.  This was extended to the base of the spinous process.  Once this was complete and the underlying ligamentum flavum was visualized, it was dissected with a curette and resected with Kerrison rongeurs.  Extensive ligamentum hypertrophy was noted, requiring a substantial amount of time and care for removal.  The dura was identified and palpated. Unfortunately, a small dural defect was noted with egress of spinal fluid.    The kerrison rongeur was then used to remove the medial facet on the leftuntil no compression was noted.  A balltip probe was used to confirm decompression of the ipsilateral S1 nerve root.  The area was checked to confirm decompression.  Duragen and tisseel were used to close the durotomy.  The tube system was then removed under microscopic visualization and hemostasis was obtained with a bipolar.    After performing the decompression at L5-S1, the metrx tubes were sequentially advanced and confirmed in position at L4-5. An 30mm by 33mm tube was locked in place to the bed side attachment.  Fluoroscopy was then removed from the field.  The microscope was then sterilely brought into the field and muscle creep was hemostased with a bipolar and resected with a pituitary rongeur.  A Bovie extender was then used to expose the spinous process and lamina.  Careful attention was placed to not violate the facet capsule. A 3  mm matchstick drill bit was then used to make a hemi-laminotomy trough until the ligamentum flavum was exposed.  This was extended to the base of the spinous process.  Once this  was complete and the underlying ligamentum flavum was visualized, it was dissected with a curette and resected with Kerrison rongeurs.  Extensive ligamentum hypertrophy was noted, requiring a substantial amount of time and care for removal.  The dura was identified and palpated. The kerrison rongeur was then used to remove the medial facet bilaterally until no compression was noted.  A balltip probe was used to confirm decompression of the ipsilateral L4 nerve root.  No CSF leak was noted.  A Depo-Medrol soaked Gelfoam pledget was placed in the defect.  The wound was copiously irrigated. The tube system was then removed under microscopic visualization and hemostasis was obtained with a bipolar.    After performing the decompression at L4-5, the metrx tubes were sequentially advanced and confirmed in position at L3-4. An 27mm by 61mm tube was locked in place to the bed side attachment.  Fluoroscopy was then removed from the field.  The microscope was then sterilely brought into the field and muscle creep was hemostased with a bipolar and resected with a pituitary rongeur.  A Bovie extender was then used to expose the spinous process and lamina.  Careful attention was placed to not violate the facet capsule. A 3 mm matchstick drill bit was then used to make a hemi-laminotomy trough until the ligamentum flavum was exposed.  This was extended to the base of the spinous process.  Once this was complete and the underlying ligamentum flavum was visualized, it was dissected with a curette and resected with Kerrison rongeurs.  Extensive ligamentum hypertrophy was noted, requiring a substantial amount of time and care for removal.  The dura was identified and palpated. The kerrison rongeur was then used to remove the medial facet bilaterally until no compression was noted.   The disc herniation was then identified, and entered with the smallest penfield while protecting the dura. The disc herniation was dissected free  and then removed in piecemeal fashion.  A balltip probe was used to confirm decompression of the ipsilateral L4 nerve root.  No CSF leak was noted.  A Depo-Medrol soaked Gelfoam pledget was placed in the defect.  The wound was copiously irrigated. The tube system was then removed under microscopic visualization and hemostasis was obtained with a bipolar.    The fascial layer was reapproximated with the use of a 0 Vicryl suture.  Subcutaneous tissue layer was reapproximated using 2-0 Vicryl suture.  3-0 nylon was used on the skin.  Patient was then rotated back to the preoperative bed awakened from anesthesia and taken to recovery all counts are correct in this case.  I performed the entire procedure with the assistance of Anabel Halon PA as an Designer, television/film set.  Domenica Weightman K. Myer Haff MD

## 2021-04-22 NOTE — Plan of Care (Signed)
New admission

## 2021-04-22 NOTE — Anesthesia Postprocedure Evaluation (Signed)
Anesthesia Post Note  Patient: Anne [name]  Procedure(s) Performed: LEFT L3-4 MICRODISCECTOMY, L4-S1 LEFT DECOMPRESSION (Left)  Patient location during evaluation: PACU Anesthesia Type: General Level of consciousness: awake and alert Pain management: pain level controlled Vital Signs Assessment: post-procedure vital signs reviewed and stable Respiratory status: spontaneous breathing and respiratory function stable Cardiovascular status: stable Anesthetic complications: no   No notable events documented.   Last Vitals:  Vitals:   04/22/21 1400 04/22/21 1433  BP: (!) 157/88 (!) 154/73  Pulse: 89 80  Resp: 12 18  Temp:  36.5 C  SpO2: 99% 97%    Last Pain:  Vitals:   04/22/21 1433  TempSrc: Oral  PainSc:                  Anne [name]

## 2021-04-22 NOTE — Anesthesia Preprocedure Evaluation (Signed)
Anesthesia Evaluation  Patient identified by MRN, date of birth, ID band Patient awake    Reviewed: Allergy & Precautions, NPO status , Patient's Chart, lab work & pertinent test results  History of Anesthesia Complications Negative for: history of anesthetic complications  Airway Mallampati: I       Dental   Pulmonary neg sleep apnea, neg COPD, Not current smoker,           Cardiovascular hypertension, Pt. on medications (-) Past MI and (-) CHF (-) dysrhythmias (-) Valvular Problems/Murmurs     Neuro/Psych neg Seizures    GI/Hepatic Neg liver ROS, neg GERD  ,  Endo/Other  neg diabetes  Renal/GU negative Renal ROS     Musculoskeletal   Abdominal   Peds  Hematology   Anesthesia Other Findings   Reproductive/Obstetrics                             Anesthesia Physical Anesthesia Plan  ASA: 2  Anesthesia Plan: General   Post-op Pain Management:    Induction: Intravenous  PONV Risk Score and Plan: 3 and Ondansetron, Dexamethasone and Propofol infusion  Airway Management Planned: Oral ETT  Additional Equipment:   Intra-op Plan:   Post-operative Plan:   Informed Consent: I have reviewed the patients History and Physical, chart, labs and discussed the procedure including the risks, benefits and alternatives for the proposed anesthesia with the patient or authorized representative who has indicated his/her understanding and acceptance.       Plan Discussed with:   Anesthesia Plan Comments:         Anesthesia Quick Evaluation

## 2021-04-22 NOTE — Anesthesia Procedure Notes (Signed)
Procedure Name: Intubation Date/Time: 04/22/2021 10:53 AM Performed by: Allena Katz, Shacarra Choe, CRNA Pre-anesthesia Checklist: Patient identified, Patient being monitored, Timeout performed, Emergency Drugs available and Suction available Patient Re-evaluated:Patient Re-evaluated prior to induction Oxygen Delivery Method: Circle system utilized Preoxygenation: Pre-oxygenation with 100% oxygen Induction Type: IV induction Ventilation: Mask ventilation without difficulty Laryngoscope Size: 3 and McGraph Grade View: Grade II Tube type: Oral Tube size: 7.0 mm Number of attempts: 1 Airway Equipment and Method: Stylet Placement Confirmation: ETT inserted through vocal cords under direct vision, positive ETCO2 and breath sounds checked- equal and bilateral Secured at: 21 cm Tube secured with: Tape Dental Injury: Teeth and Oropharynx as per pre-operative assessment  Comments: Anterior pressure and limited neck mobility

## 2021-04-22 NOTE — Transfer of Care (Signed)
Immediate Anesthesia Transfer of Care Note  Patient: Anne [name]  Procedure(s) Performed: LEFT L3-4 MICRODISCECTOMY, L4-S1 LEFT DECOMPRESSION (Left)  Patient Location: PACU  Anesthesia Type:General  Level of Consciousness: awake and alert   Airway & Oxygen Therapy: Patient Spontanous Breathing  Post-op Assessment: Report given to RN and Post -op Vital signs reviewed and stable  Post vital signs: Reviewed and stable  Last Vitals:  Vitals Value Taken Time  BP 150/81 04/22/21 1312  Temp    Pulse 96 04/22/21 1315  Resp 15 04/22/21 1315  SpO2 99 % 04/22/21 1315  Vitals shown include unvalidated device data.  Last Pain:  Vitals:   04/22/21 0905  TempSrc: Temporal  PainSc: 7          Complications: No notable events documented.

## 2021-04-22 NOTE — H&P (Signed)
  I have reviewed and confirmed my history and physical from 03/27/21 with no additions or changes. Plan for L4-S1 decompression and left L3-4 microdiscectomy.  Risks and benefits reviewed.  Heart sounds normal no MRG. Chest Clear to Auscultation Bilaterally.

## 2021-04-23 ENCOUNTER — Encounter: Payer: Self-pay | Admitting: Neurosurgery

## 2021-04-23 DIAGNOSIS — M5116 Intervertebral disc disorders with radiculopathy, lumbar region: Secondary | ICD-10-CM | POA: Diagnosis not present

## 2021-04-23 MED ORDER — ACETAMINOPHEN 325 MG PO TABS
650.0000 mg | ORAL_TABLET | ORAL | Status: AC | PRN
Start: 2021-04-23 — End: ?

## 2021-04-23 MED ORDER — HYDROCODONE-ACETAMINOPHEN 5-325 MG PO TABS: 1.0000 | ORAL_TABLET | ORAL | 0 refills | Status: DC | PRN

## 2021-04-23 MED ORDER — METHOCARBAMOL 500 MG PO TABS: 500.0000 mg | ORAL_TABLET | Freq: Four times a day (QID) | ORAL | 0 refills | Status: DC | PRN

## 2021-04-23 NOTE — Discharge Summary (Signed)
Physician Discharge Summary  Patient ID: Anne [name] MRN: [id number] DOB/AGE: 1941-04-15 80 y.o.  Admit date: 04/22/2021 Discharge date: 04/23/2021  Admission Diagnoses: lumbar radiculopathy  Discharge Diagnoses:  Active Problems:   Lumbar radiculopathy   Discharged Condition: good  Hospital Course: [name] was admitted for observation after surgery.  She did very well with surgery and was stable for discharge on POD1.  Consults: None  Significant Diagnostic Studies: radiology: X-Ray: localization  Treatments: surgery: L3-S1 decompression with Left L3-4 microdiscectomy  Discharge Exam: Blood pressure 130/63, pulse 70, temperature 98.4 F (36.9 C), resp. rate 17, SpO2 96 %. General appearance: alert and cooperative CNI 5/5 throughout   Disposition: Discharge disposition: 01-Home or Self Care       Discharge Instructions     Discharge patient   Complete by: As directed    Discharge disposition: 01-Home or Self Care   Discharge patient date: 04/23/2021   Incentive spirometry RT   Complete by: As directed       Allergies as of 04/23/2021   No Known Allergies      Medication List     STOP taking these medications    naproxen sodium 220 MG tablet Commonly known as: ALEVE       TAKE these medications    acetaminophen 325 MG tablet Commonly known as: TYLENOL Take 2 tablets (650 mg total) by mouth every 4 (four) hours as needed for mild pain ((score 1 to 3) or temp > 100.5).   amLODipine 5 MG tablet Commonly known as: NORVASC Take 5 mg by mouth daily.   B-complex with vitamin C tablet Take 1 tablet by mouth daily.   carteolol 1 % ophthalmic solution Commonly known as: OCUPRESS Place 1 drop into both eyes 2 (two) times daily.   celecoxib 100 MG capsule Commonly known as: CELEBREX Take 100 mg by mouth 2 (two) times daily.   CENTRUM SILVER 50+WOMEN PO Take 1 tablet by mouth daily.   cholecalciferol 25 MCG  (1000 UNIT) tablet Commonly known as: VITAMIN D3 Take 1,000 Units by mouth daily.   citalopram 10 MG tablet Commonly known as: CELEXA Take 10 mg by mouth daily.   Fish Oil 1000 MG Caps Take by mouth.   HYDROcodone-acetaminophen 5-325 MG tablet Commonly known as: NORCO/VICODIN Take 1 tablet by mouth every 6 (six) hours as needed for moderate pain. What changed: Another medication with the same name was added. Make sure you understand how and when to take each.   HYDROcodone-acetaminophen 5-325 MG tablet Commonly known as: NORCO/VICODIN Take 1 tablet by mouth every 4 (four) hours as needed for moderate pain ((score 4 to 6)). What changed: You were already taking a medication with the same name, and this prescription was added. Make sure you understand how and when to take each.   MAGNESIUM GLYCINATE PO Take 120 mg by mouth daily.   Melatonin 3 MG Caps Take 6 mg by mouth at bedtime.   methocarbamol 500 MG tablet Commonly known as: ROBAXIN Take 1 tablet (500 mg total) by mouth every 6 (six) hours as needed for muscle spasms.   Theanine 200 MG Caps Take 200 mg by mouth at bedtime.        Follow-up Information     Venetia Night, MD Follow up in 2 week(s).   Specialty: Neurosurgery Contact information: [address] [place] [address] [phone]  SignedVenetia Night 04/23/2021, 7:58 AM

## 2021-04-23 NOTE — Progress Notes (Signed)
Blood pressure (!) 128/57, pulse 78, temperature 98.5 F (36.9 C), resp. rate 17, SpO2 99 %. IV catheter removed site was clean, dry and intact. Discharge packet was discussed at bedside with pt and her daughter. Both verbalizing understanding of the information provided. Pt transported via W/C down to private car with all belongings.

## 2021-04-23 NOTE — Discharge Instructions (Signed)
Your surgeon has performed an operation on your lumbar spine (low back) to relieve pressure on one or more nerves. Many times, patients feel better immediately after surgery and can "overdo it." Even if you feel well, it is important that you follow these activity guidelines. If you do not let your back heal properly from the surgery, you can increase the chance of a disc herniation and/or return of your symptoms. The following are instructions to help in your recovery once you have been discharged from the hospital.  * OK to take anti-inflammatory medications after surgery (naproxen [Aleve], ibuprofen [Advil, Motrin], celecoxib [Celebrex], etc.)  Activity    No bending, lifting, or twisting ("BLT"). Avoid lifting objects heavier than 10 pounds (gallon milk jug).  Where possible, avoid household activities that involve lifting, bending, pushing, or pulling such as laundry, vacuuming, grocery shopping, and childcare. Try to arrange for help from friends and family for these activities while your back heals.  Increase physical activity slowly as tolerated.  Taking short walks is encouraged, but avoid strenuous exercise. Do not jog, run, bicycle, lift weights, or participate in any other exercises unless specifically allowed by your doctor. Avoid prolonged sitting, including car rides.  Talk to your doctor before resuming sexual activity.  You should not drive until cleared by your doctor.  Until released by your doctor, you should not return to work or school.  You should rest at home and let your body heal.   You may shower two days after your surgery.  After showering, lightly dab your incision dry. Do not take a tub bath or go swimming for 3 weeks, or until approved by your doctor at your follow-up appointment.  If you smoke, we strongly recommend that you quit.  Smoking has been proven to interfere with normal healing in your back and will dramatically reduce the success rate of your surgery.  Please contact [facility] (800-QUIT-NOW) and use the resources at www.[facility].com for assistance in stopping smoking.  Surgical Incision   If you have a dressing on your incision, you may remove it three days after your surgery. Keep your incision area clean and dry.  If you have staples or stitches on your incision, you should have a follow up scheduled for removal. If you do not have staples or stitches, you will have steri-strips (small pieces of surgical tape) or Dermabond glue. The steri-strips/glue should begin to peel away within about a week (it is fine if the steri-strips fall off before then). If the strips are still in place one week after your surgery, you may gently remove them.  Diet            You may return to your usual diet. Be sure to stay hydrated.  When to Contact Us  Although your surgery and recovery will likely be uneventful, you may have some residual numbness, aches, and pains in your back and/or legs. This is normal and should improve in the next few weeks.  However, should you experience any of the following, contact us immediately: New numbness or weakness Pain that is progressively getting worse, and is not relieved by your pain medications or rest Bleeding, redness, swelling, pain, or drainage from surgical incision Chills or flu-like symptoms Fever greater than 101.0 F (38.3 C) Problems with bowel or bladder functions Difficulty breathing or shortness of breath Warmth, tenderness, or swelling in your calf  Contact Information During office hours (Monday-Friday 9 am to 5 pm), please call your physician at [phone] After   hours and weekends, please call [phone] and speak with the answering service, who will contact the doctor on call.  If that fails, call the [facility] Operator at [phone] and ask for the Neurosurgery Resident On Call  For a life-threatening emergency, call 911  

## 2021-04-23 NOTE — Progress Notes (Signed)
    Attending Progress Note  History: Araina A Del Betsy Coder is here for lumbar radiculopathy.  She is POD1 from surgery and doing well.  Physical Exam: Vitals:   04/23/21 0017 04/23/21 0443  BP: 127/67 130/63  Pulse: 74 70  Resp: 17 17  Temp: 98.3 F (36.8 C) 98.4 F (36.9 C)  SpO2: 96% 96%    AA Ox3 CNI  Strength:5/5 throughout BLE  Data:  No results for input(s): NA, K, CL, CO2, BUN, CREATININE, LABGLOM, GLUCOSE, CALCIUM in the last 168 hours. No results for input(s): AST, ALT, ALKPHOS in the last 168 hours.  Invalid input(s): TBILI   No results for input(s): WBC, HGB, HCT, PLT in the last 168 hours. No results for input(s): APTT, INR in the last 168 hours.       Other tests/results: none  Assessment/Plan:  Makailee A Del Betsy Coder is doing well  - mobilize - pain control - DVT prophylaxis   Venetia Night MD, [facility] Department of Neurosurgery

## 2021-04-29 ENCOUNTER — Ambulatory Visit: Payer: Medicare HMO

## 2021-05-01 ENCOUNTER — Other Ambulatory Visit: Payer: Self-pay | Admitting: Unknown Physician Specialty

## 2021-05-01 DIAGNOSIS — E041 Nontoxic single thyroid nodule: Secondary | ICD-10-CM

## 2021-06-01 ENCOUNTER — Ambulatory Visit
Admission: RE | Admit: 2021-06-01 | Discharge: 2021-06-01 | Disposition: A | Discharge status: Home / Self Care | Payer: Medicare HMO | Source: Ambulatory Visit | Attending: Unknown Physician Specialty | Admitting: Unknown Physician Specialty

## 2021-06-01 ENCOUNTER — Other Ambulatory Visit: Payer: Self-pay

## 2021-06-01 DIAGNOSIS — E041 Nontoxic single thyroid nodule: Secondary | ICD-10-CM | POA: Diagnosis not present

## 2021-06-04 ENCOUNTER — Other Ambulatory Visit: Payer: Self-pay | Admitting: Orthopedic Surgery

## 2021-06-04 DIAGNOSIS — M19111 Post-traumatic osteoarthritis, right shoulder: Secondary | ICD-10-CM

## 2021-06-04 DIAGNOSIS — M19112 Post-traumatic osteoarthritis, left shoulder: Secondary | ICD-10-CM

## 2021-06-10 ENCOUNTER — Other Ambulatory Visit: Payer: Self-pay | Admitting: Unknown Physician Specialty

## 2021-06-10 ENCOUNTER — Other Ambulatory Visit (HOSPITAL_COMMUNITY): Payer: Self-pay | Admitting: Unknown Physician Specialty

## 2021-06-10 DIAGNOSIS — E041 Nontoxic single thyroid nodule: Secondary | ICD-10-CM

## 2021-06-26 ENCOUNTER — Other Ambulatory Visit: Payer: Self-pay

## 2021-06-26 ENCOUNTER — Ambulatory Visit
Admission: RE | Admit: 2021-06-26 | Discharge: 2021-06-26 | Disposition: A | Discharge status: Home / Self Care | Payer: Medicare HMO | Source: Ambulatory Visit | Attending: Orthopedic Surgery | Admitting: Orthopedic Surgery

## 2021-06-26 DIAGNOSIS — M19111 Post-traumatic osteoarthritis, right shoulder: Secondary | ICD-10-CM | POA: Diagnosis present

## 2021-08-21 ENCOUNTER — Other Ambulatory Visit: Payer: Self-pay | Admitting: Orthopedic Surgery

## 2021-08-27 ENCOUNTER — Encounter
Admission: RE | Admit: 2021-08-27 | Discharge: 2021-08-27 | Disposition: A | Discharge status: Home / Self Care | Payer: Medicare HMO | Source: Ambulatory Visit | Attending: Orthopedic Surgery | Admitting: Orthopedic Surgery

## 2021-08-27 ENCOUNTER — Other Ambulatory Visit: Payer: Self-pay

## 2021-08-27 ENCOUNTER — Other Ambulatory Visit: Payer: BC Managed Care – PPO

## 2021-08-27 VITALS — BP 151/82 | HR 94 | Temp 98.2°F | Resp 16 | Ht 65.0 in | Wt 142.1 lb

## 2021-08-27 DIAGNOSIS — Z01812 Encounter for preprocedural laboratory examination: Secondary | ICD-10-CM

## 2021-08-27 DIAGNOSIS — Z01818 Encounter for other preprocedural examination: Secondary | ICD-10-CM | POA: Diagnosis not present

## 2021-08-27 HISTORY — DX: Gastro-esophageal reflux disease without esophagitis: K21.9

## 2021-08-27 LAB — CBC WITH DIFFERENTIAL/PLATELET
Abs Immature Granulocytes: 0.02 10*3/uL (ref 0.00–0.07)
Basophils Absolute: 0.1 10*3/uL (ref 0.0–0.1)
Basophils Relative: 1 %
Eosinophils Absolute: 0.1 10*3/uL (ref 0.0–0.5)
Eosinophils Relative: 2 %
HCT: 41.3 % (ref 36.0–46.0)
Hemoglobin: 14.4 g/dL (ref 12.0–15.0)
Immature Granulocytes: 0 %
Lymphocytes Relative: 30 %
Lymphs Abs: 2 10*3/uL (ref 0.7–4.0)
MCH: 33.6 pg (ref 26.0–34.0)
MCHC: 34.9 g/dL (ref 30.0–36.0)
MCV: 96.5 fL (ref 80.0–100.0)
Monocytes Absolute: 0.6 10*3/uL (ref 0.1–1.0)
Monocytes Relative: 10 %
Neutro Abs: 3.8 10*3/uL (ref 1.7–7.7)
Neutrophils Relative %: 57 %
Platelets: 304 10*3/uL (ref 150–400)
RBC: 4.28 MIL/uL (ref 3.87–5.11)
RDW: 12.9 % (ref 11.5–15.5)
WBC: 6.6 10*3/uL (ref 4.0–10.5)
nRBC: 0 % (ref 0.0–0.2)

## 2021-08-27 LAB — SURGICAL PCR SCREEN
MRSA, PCR: NEGATIVE
Staphylococcus aureus: NEGATIVE

## 2021-08-27 LAB — TYPE AND SCREEN
ABO/RH(D): A POS
Antibody Screen: NEGATIVE

## 2021-08-27 LAB — URINALYSIS, ROUTINE W REFLEX MICROSCOPIC
Bilirubin Urine: NEGATIVE
Glucose, UA: NEGATIVE mg/dL
Hgb urine dipstick: NEGATIVE
Ketones, ur: 5 mg/dL — AB
Leukocytes,Ua: NEGATIVE
Nitrite: NEGATIVE
Protein, ur: NEGATIVE mg/dL
Specific Gravity, Urine: 1.023 (ref 1.005–1.030)
pH: 5 (ref 5.0–8.0)

## 2021-08-27 LAB — COMPREHENSIVE METABOLIC PANEL
ALT: 35 U/L (ref 0–44)
AST: 27 U/L (ref 15–41)
Albumin: 4.3 g/dL (ref 3.5–5.0)
Alkaline Phosphatase: 49 U/L (ref 38–126)
Anion gap: 9 (ref 5–15)
BUN: 18 mg/dL (ref 8–23)
CO2: 27 mmol/L (ref 22–32)
Calcium: 10 mg/dL (ref 8.9–10.3)
Chloride: 102 mmol/L (ref 98–111)
Creatinine, Ser: 0.75 mg/dL (ref 0.44–1.00)
GFR, Estimated: >60 mL/min (ref >60)
Glucose, Bld: 95 mg/dL (ref 70–99)
Potassium: 4.9 mmol/L (ref 3.5–5.1)
Sodium: 138 mmol/L (ref 135–145)
Total Bilirubin: 1 mg/dL (ref 0.3–1.2)
Total Protein: 7.3 g/dL (ref 6.5–8.1)

## 2021-08-27 NOTE — Patient Instructions (Addendum)
Your procedure is scheduled on: Monday 09/07/21 Report to the Registration Desk on the 1st floor of the [facility]. To find out your arrival time, please call [phone] between 1PM - 3PM on: Friday 09/04/21  REMEMBER: Instructions that are not followed completely may result in serious medical risk, up to and including death; or upon the discretion of your surgeon and anesthesiologist your surgery may need to be rescheduled.  Do not eat food after midnight the night before surgery.  No gum chewing, lozengers or hard candies.  You may however, drink CLEAR liquids up to 2 hours before you are scheduled to arrive for your surgery. Do not drink anything within 2 hours of your scheduled arrival time.  Clear liquids include: - water  - apple juice without pulp - gatorade (not RED, PURPLE, OR BLUE) - black coffee or tea (Do NOT add milk or creamers to the coffee or tea) Do NOT drink anything that is not on this list.  In addition, your doctor has ordered for you to drink the provided  Ensure Pre-Surgery Clear Carbohydrate Drink  Gatorade G2 Drinking this carbohydrate drink up to two hours before surgery helps to reduce insulin resistance and improve patient outcomes. Please complete drinking 2 hours prior to scheduled arrival time.  TAKE THESE MEDICATIONS THE MORNING OF SURGERY WITH A SIP OF WATER: amLODipine (NORVASC) 2.5 MG tablet omeprazole (PRILOSEC) 20 MG capsule (take one the night before and one on the morning of surgery - helps to prevent nausea after surgery.)  One week prior to surgery: Stop Anti-inflammatories (NSAIDS) such as Advil, Aleve, Ibuprofen, Motrin, Naproxen, Naprosyn and Aspirin based products such as Excedrin, Goodys Powder, BC Powder. Stop ANY OVER THE COUNTER supplements until after surgery .B Complex-C (B-COMPLEX WITH VITAMIN C) tablet, cholecalciferol (VITAMIN D3) 25 MCG (1000 UNIT) tablet, Multiple Vitamins-Minerals (CENTRUM SILVER 50+WOMEN PO), Omega-3 Fatty  Acids (FISH OIL) 1000 MG CAPS.  You may however, continue to take Tylenol if needed for pain up until the day of surgery.  No Alcohol for 24 hours before or after surgery.  No Smoking including e-cigarettes for 24 hours prior to surgery.  No chewable tobacco products for at least 6 hours prior to surgery.  No nicotine patches on the day of surgery.  Do not use any "recreational" drugs for at least a week prior to your surgery.  Please be advised that the combination of cocaine and anesthesia may have negative outcomes, up to and including death. If you test positive for cocaine, your surgery will be cancelled.  On the morning of surgery brush your teeth with toothpaste and water, you may rinse your mouth with mouthwash if you wish. Do not swallow any toothpaste or mouthwash.  Use CHG Soap or wipes as directed on instruction sheet.  Do not wear jewelry, make-up, hairpins, clips or nail polish.  Do not wear lotions, powders, or perfumes.   Do not shave body from the neck down 48 hours prior to surgery just in case you cut yourself which could leave a site for infection.  Also, freshly shaved skin may become irritated if using the CHG soap.  Do not bring valuables to the hospital. [facility] is not responsible for any missing/lost belongings or valuables.   Total Shoulder Arthroplasty:  use Benzolyl Peroxide 5% Gel as directed on instruction sheet.  Notify your doctor if there is any change in your medical condition (cold, fever, infection).  Wear comfortable clothing (specific to your surgery type) to the hospital.  After surgery, you can help prevent lung complications by doing breathing exercises.  Take deep breaths and cough every 1-2 hours. Your doctor may order a device called an Incentive Spirometer to help you take deep breaths.  If you are being admitted to the hospital overnight, leave your suitcase in the car. After surgery it may be brought to your room.  If you are  taking public transportation, you will need to have a responsible adult (18 years or older) with you. Please confirm with your physician that it is acceptable to use public transportation.   Please call the Pre-admissions Testing Dept. at [phone] if you have any questions about these instructions.  Surgery Visitation Policy:  Patients undergoing a surgery or procedure may have one family member or support person with them as long as that person is not COVID-19 positive or experiencing its symptoms.  That person may remain in the waiting area during the procedure and may rotate out with other people.  Inpatient Visitation:    Visiting hours are 7 a.m. to 8 p.m. Up to two visitors ages 16+ are allowed at one time in a patient room. The visitors may rotate out with other people during the day. Visitors must check out when they leave, or other visitors will not be allowed. One designated support person may remain overnight. The visitor must pass COVID-19 screenings, use hand sanitizer when entering and exiting the patient's room and wear a mask at all times, including in the patient's room. Patients must also wear a mask when staff or their visitor are in the room. Masking is required regardless of vaccination status.

## 2021-09-03 ENCOUNTER — Other Ambulatory Visit: Payer: Self-pay

## 2021-09-03 ENCOUNTER — Other Ambulatory Visit
Admission: RE | Admit: 2021-09-03 | Discharge: 2021-09-03 | Disposition: A | Discharge status: Home / Self Care | Payer: Medicare HMO | Source: Ambulatory Visit | Attending: Orthopedic Surgery | Admitting: Orthopedic Surgery

## 2021-09-03 DIAGNOSIS — Z20822 Contact with and (suspected) exposure to covid-19: Secondary | ICD-10-CM | POA: Diagnosis not present

## 2021-09-03 DIAGNOSIS — Z01812 Encounter for preprocedural laboratory examination: Secondary | ICD-10-CM | POA: Diagnosis not present

## 2021-09-04 LAB — SARS CORONAVIRUS 2 (TAT 6-24 HRS): SARS Coronavirus 2: NEGATIVE

## 2021-09-07 ENCOUNTER — Encounter
Admission: RE | Disposition: A | Discharge status: Home / Self Care | Payer: Self-pay | Source: Home / Self Care | Attending: Orthopedic Surgery

## 2021-09-07 ENCOUNTER — Observation Stay
Admission: RE | Admit: 2021-09-07 | Discharge: 2021-09-08 | Disposition: A | Discharge status: Home / Self Care | Payer: Medicare HMO | Attending: Orthopedic Surgery | Admitting: Orthopedic Surgery

## 2021-09-07 ENCOUNTER — Other Ambulatory Visit: Payer: Self-pay

## 2021-09-07 ENCOUNTER — Encounter: Payer: Self-pay | Admitting: Orthopedic Surgery

## 2021-09-07 ENCOUNTER — Ambulatory Visit: Payer: Medicare HMO | Admitting: Registered Nurse

## 2021-09-07 ENCOUNTER — Ambulatory Visit: Payer: Medicare HMO

## 2021-09-07 ENCOUNTER — Observation Stay: Payer: Medicare HMO

## 2021-09-07 DIAGNOSIS — Z419 Encounter for procedure for purposes other than remedying health state, unspecified: Secondary | ICD-10-CM

## 2021-09-07 DIAGNOSIS — Z79899 Other long term (current) drug therapy: Secondary | ICD-10-CM | POA: Diagnosis not present

## 2021-09-07 DIAGNOSIS — I10 Essential (primary) hypertension: Secondary | ICD-10-CM | POA: Diagnosis not present

## 2021-09-07 DIAGNOSIS — Z96653 Presence of artificial knee joint, bilateral: Secondary | ICD-10-CM | POA: Insufficient documentation

## 2021-09-07 DIAGNOSIS — M19112 Post-traumatic osteoarthritis, left shoulder: Secondary | ICD-10-CM | POA: Insufficient documentation

## 2021-09-07 DIAGNOSIS — S42202P Unspecified fracture of upper end of left humerus, subsequent encounter for fracture with malunion: Secondary | ICD-10-CM | POA: Diagnosis not present

## 2021-09-07 DIAGNOSIS — X58XXXA Exposure to other specified factors, initial encounter: Secondary | ICD-10-CM | POA: Insufficient documentation

## 2021-09-07 DIAGNOSIS — Z96619 Presence of unspecified artificial shoulder joint: Secondary | ICD-10-CM

## 2021-09-07 HISTORY — PX: REVERSE SHOULDER ARTHROPLASTY: SHX5054

## 2021-09-07 SURGERY — ARTHROPLASTY, SHOULDER, TOTAL, REVERSE
Anesthesia: General | Site: Shoulder | Laterality: Right

## 2021-09-07 MED ORDER — PHENYLEPHRINE HCL (PRESSORS) 10 MG/ML IV SOLN
INTRAVENOUS | Status: DC | PRN
  Administered 2021-09-07: 160 ug via INTRAVENOUS

## 2021-09-07 MED ORDER — OXYCODONE HCL 5 MG PO TABS
2.5000 mg | ORAL_TABLET | ORAL | Status: DC | PRN
Start: 2021-09-07 — End: 2021-09-08
  Administered 2021-09-07: 5 mg via ORAL

## 2021-09-07 MED ORDER — LACTATED RINGERS IR SOLN
Status: DC | PRN
  Administered 2021-09-07: 3000 mL

## 2021-09-07 MED ORDER — SODIUM CHLORIDE 0.9 % IR SOLN
Status: DC | PRN
  Administered 2021-09-07: 500 mL

## 2021-09-07 MED ORDER — TRANEXAMIC ACID-NACL 1000-0.7 MG/100ML-% IV SOLN
INTRAVENOUS | Status: AC
  Administered 2021-09-07: 1000 mg via INTRAVENOUS
  Filled 2021-09-07: qty 100

## 2021-09-07 MED ORDER — BISACODYL 10 MG RE SUPP
10.0000 mg | Freq: Every day | RECTAL | Status: DC | PRN
  Filled 2021-09-07: qty 1

## 2021-09-07 MED ORDER — OXYCODONE HCL 5 MG PO TABS: 5.0000 mg | ORAL_TABLET | ORAL | Status: DC | PRN

## 2021-09-07 MED ORDER — ONDANSETRON HCL 4 MG/2ML IJ SOLN: 4.0000 mg | Freq: Four times a day (QID) | INTRAMUSCULAR | Status: DC | PRN

## 2021-09-07 MED ORDER — FENTANYL CITRATE (PF) 100 MCG/2ML IJ SOLN: 25.0000 ug | INTRAMUSCULAR | Status: DC | PRN

## 2021-09-07 MED ORDER — HYDROMORPHONE HCL 1 MG/ML IJ SOLN: 0.2000 mg | INTRAMUSCULAR | Status: DC | PRN

## 2021-09-07 MED ORDER — KETAMINE HCL 50 MG/5ML IJ SOSY
PREFILLED_SYRINGE | INTRAMUSCULAR | Status: AC
  Filled 2021-09-07: qty 5

## 2021-09-07 MED ORDER — LIDOCAINE HCL (PF) 2 % IJ SOLN
INTRAMUSCULAR | Status: AC
  Filled 2021-09-07: qty 5

## 2021-09-07 MED ORDER — EPHEDRINE 5 MG/ML INJ
INTRAVENOUS | Status: AC
  Filled 2021-09-07: qty 10

## 2021-09-07 MED ORDER — ROCURONIUM BROMIDE 10 MG/ML (PF) SYRINGE
PREFILLED_SYRINGE | INTRAVENOUS | Status: AC
  Filled 2021-09-07: qty 10

## 2021-09-07 MED ORDER — CHLORHEXIDINE GLUCONATE 0.12 % MT SOLN
15.0000 mL | Freq: Once | OROMUCOSAL | Status: AC
  Administered 2021-09-07: 15 mL via OROMUCOSAL

## 2021-09-07 MED ORDER — FENTANYL CITRATE (PF) 100 MCG/2ML IJ SOLN
INTRAMUSCULAR | Status: DC | PRN
  Administered 2021-09-07 (×6): 25 ug via INTRAVENOUS

## 2021-09-07 MED ORDER — NEOMYCIN-POLYMYXIN B GU 40-200000 IR SOLN
Status: DC | PRN
  Administered 2021-09-07: 14 mL

## 2021-09-07 MED ORDER — BUPIVACAINE HCL (PF) 0.5 % IJ SOLN
INTRAMUSCULAR | Status: AC
  Filled 2021-09-07: qty 10

## 2021-09-07 MED ORDER — PROPOFOL 10 MG/ML IV BOLUS
INTRAVENOUS | Status: AC
  Filled 2021-09-07: qty 20

## 2021-09-07 MED ORDER — FENTANYL CITRATE (PF) 100 MCG/2ML IJ SOLN
INTRAMUSCULAR | Status: AC
  Filled 2021-09-07: qty 2

## 2021-09-07 MED ORDER — PROPYLENE GLYCOL 0.6 % OP SOLN: 1.0000 [drp] | Freq: Every day | OPHTHALMIC | Status: DC | PRN

## 2021-09-07 MED ORDER — BUPIVACAINE-EPINEPHRINE (PF) 0.25% -1:200000 IJ SOLN
INTRAMUSCULAR | Status: AC
  Filled 2021-09-07: qty 30

## 2021-09-07 MED ORDER — SENNOSIDES-DOCUSATE SODIUM 8.6-50 MG PO TABS
1.0000 | ORAL_TABLET | Freq: Every evening | ORAL | Status: DC | PRN
  Filled 2021-09-07: qty 1

## 2021-09-07 MED ORDER — ASPIRIN EC 325 MG PO TBEC: 325.0000 mg | DELAYED_RELEASE_TABLET | Freq: Every day | ORAL | Status: DC

## 2021-09-07 MED ORDER — TRAMADOL HCL 50 MG PO TABS
ORAL_TABLET | ORAL | Status: AC
  Administered 2021-09-07: 50 mg via ORAL
  Filled 2021-09-07: qty 1

## 2021-09-07 MED ORDER — TRANEXAMIC ACID-NACL 1000-0.7 MG/100ML-% IV SOLN
1000.0000 mg | INTRAVENOUS | Status: AC
  Administered 2021-09-07: 1000 mg via INTRAVENOUS

## 2021-09-07 MED ORDER — ACETAMINOPHEN 10 MG/ML IV SOLN
INTRAVENOUS | Status: DC | PRN
  Administered 2021-09-07: 1000 mg via INTRAVENOUS

## 2021-09-07 MED ORDER — NEOMYCIN-POLYMYXIN B GU 40-200000 IR SOLN
Status: AC
  Filled 2021-09-07: qty 20

## 2021-09-07 MED ORDER — PHENYLEPHRINE HCL-NACL 20-0.9 MG/250ML-% IV SOLN
INTRAVENOUS | Status: AC
  Filled 2021-09-07: qty 250

## 2021-09-07 MED ORDER — TRANEXAMIC ACID-NACL 1000-0.7 MG/100ML-% IV SOLN
INTRAVENOUS | Status: AC
  Filled 2021-09-07: qty 100

## 2021-09-07 MED ORDER — SUCCINYLCHOLINE CHLORIDE 200 MG/10ML IV SOSY
PREFILLED_SYRINGE | INTRAVENOUS | Status: DC | PRN
  Administered 2021-09-07: 60 mg via INTRAVENOUS

## 2021-09-07 MED ORDER — TRANEXAMIC ACID-NACL 1000-0.7 MG/100ML-% IV SOLN: 1000.0000 mg | Freq: Once | INTRAVENOUS | Status: AC

## 2021-09-07 MED ORDER — SODIUM CHLORIDE 0.9 % IV SOLN: INTRAVENOUS | Status: DC

## 2021-09-07 MED ORDER — MENTHOL 3 MG MT LOZG
1.0000 | LOZENGE | OROMUCOSAL | Status: DC | PRN
  Filled 2021-09-07: qty 9

## 2021-09-07 MED ORDER — METHOCARBAMOL 500 MG PO TABS: 500.0000 mg | ORAL_TABLET | Freq: Four times a day (QID) | ORAL | Status: DC | PRN

## 2021-09-07 MED ORDER — OXYCODONE HCL 5 MG/5ML PO SOLN: 5.0000 mg | Freq: Once | ORAL | Status: DC | PRN

## 2021-09-07 MED ORDER — LACTATED RINGERS IV SOLN: INTRAVENOUS | Status: DC

## 2021-09-07 MED ORDER — KETAMINE HCL 10 MG/ML IJ SOLN
INTRAMUSCULAR | Status: DC | PRN
  Administered 2021-09-07: 10 mg via INTRAVENOUS
  Administered 2021-09-07: 20 mg via INTRAVENOUS
  Administered 2021-09-07 (×2): 10 mg via INTRAVENOUS

## 2021-09-07 MED ORDER — FENTANYL CITRATE PF 50 MCG/ML IJ SOSY
PREFILLED_SYRINGE | INTRAMUSCULAR | Status: AC
  Administered 2021-09-07: 50 ug via INTRAVENOUS
  Filled 2021-09-07: qty 1

## 2021-09-07 MED ORDER — SUCCINYLCHOLINE CHLORIDE 200 MG/10ML IV SOSY
PREFILLED_SYRINGE | INTRAVENOUS | Status: AC
  Filled 2021-09-07: qty 10

## 2021-09-07 MED ORDER — SYSTANE NIGHTTIME OP OINT: TOPICAL_OINTMENT | Freq: Every day | OPHTHALMIC | Status: DC

## 2021-09-07 MED ORDER — DEXAMETHASONE SODIUM PHOSPHATE 10 MG/ML IJ SOLN
INTRAMUSCULAR | Status: AC
  Filled 2021-09-07: qty 1

## 2021-09-07 MED ORDER — PROMETHAZINE HCL 25 MG/ML IJ SOLN
6.2500 mg | INTRAMUSCULAR | Status: DC | PRN
Start: 2021-09-07 — End: 2021-09-07

## 2021-09-07 MED ORDER — ROCURONIUM BROMIDE 100 MG/10ML IV SOLN
INTRAVENOUS | Status: DC | PRN
Start: 2021-09-07 — End: 2021-09-07
  Administered 2021-09-07: 40 mg via INTRAVENOUS
  Administered 2021-09-07 (×2): 10 mg via INTRAVENOUS
  Administered 2021-09-07 (×2): 20 mg via INTRAVENOUS

## 2021-09-07 MED ORDER — BUPIVACAINE LIPOSOME 1.3 % IJ SUSP
INTRAMUSCULAR | Status: DC | PRN
  Administered 2021-09-07: 10 mL via PERINEURAL

## 2021-09-07 MED ORDER — PANTOPRAZOLE SODIUM 40 MG PO TBEC
40.0000 mg | DELAYED_RELEASE_TABLET | Freq: Every day | ORAL | Status: DC
  Administered 2021-09-07 – 2021-09-08 (×2): 40 mg via ORAL
  Filled 2021-09-07 (×2): qty 1

## 2021-09-07 MED ORDER — AMLODIPINE BESYLATE 5 MG PO TABS
2.5000 mg | ORAL_TABLET | Freq: Every day | ORAL | Status: DC
  Administered 2021-09-08: 2.5 mg via ORAL
  Filled 2021-09-07: qty 0.5

## 2021-09-07 MED ORDER — DOCUSATE SODIUM 100 MG PO CAPS
100.0000 mg | ORAL_CAPSULE | Freq: Two times a day (BID) | ORAL | Status: DC
  Administered 2021-09-07 – 2021-09-08 (×2): 100 mg via ORAL
  Filled 2021-09-07 (×3): qty 1

## 2021-09-07 MED ORDER — FENTANYL CITRATE PF 50 MCG/ML IJ SOSY: 50.0000 ug | PREFILLED_SYRINGE | Freq: Once | INTRAMUSCULAR | Status: AC

## 2021-09-07 MED ORDER — PHENYLEPHRINE HCL-NACL 20-0.9 MG/250ML-% IV SOLN
INTRAVENOUS | Status: DC | PRN
  Administered 2021-09-07: 10 ug/min via INTRAVENOUS

## 2021-09-07 MED ORDER — BUPIVACAINE LIPOSOME 1.3 % IJ SUSP
INTRAMUSCULAR | Status: AC
  Filled 2021-09-07: qty 20

## 2021-09-07 MED ORDER — ADULT MULTIVITAMIN W/MINERALS CH
1.0000 | ORAL_TABLET | Freq: Every day | ORAL | Status: DC
  Administered 2021-09-08: 1 via ORAL
  Filled 2021-09-07: qty 1

## 2021-09-07 MED ORDER — ONDANSETRON HCL 4 MG PO TABS: 4.0000 mg | ORAL_TABLET | Freq: Four times a day (QID) | ORAL | Status: DC | PRN

## 2021-09-07 MED ORDER — ACETAMINOPHEN 500 MG PO TABS: 1000.0000 mg | ORAL_TABLET | Freq: Three times a day (TID) | ORAL | Status: DC

## 2021-09-07 MED ORDER — VANCOMYCIN HCL 1000 MG IV SOLR
INTRAVENOUS | Status: AC
  Filled 2021-09-07: qty 20

## 2021-09-07 MED ORDER — CEFAZOLIN SODIUM-DEXTROSE 2-4 GM/100ML-% IV SOLN
2.0000 g | INTRAVENOUS | Status: AC
  Administered 2021-09-07 (×2): 2 g via INTRAVENOUS

## 2021-09-07 MED ORDER — CEFAZOLIN SODIUM-DEXTROSE 2-4 GM/100ML-% IV SOLN
2.0000 g | Freq: Four times a day (QID) | INTRAVENOUS | Status: AC
Start: 2021-09-07 — End: 2021-09-08
  Administered 2021-09-07: 2 g via INTRAVENOUS

## 2021-09-07 MED ORDER — METOCLOPRAMIDE HCL 10 MG PO TABS
5.0000 mg | ORAL_TABLET | Freq: Three times a day (TID) | ORAL | Status: DC | PRN
Start: 2021-09-07 — End: 2021-09-08

## 2021-09-07 MED ORDER — TRAMADOL HCL 50 MG PO TABS
50.0000 mg | ORAL_TABLET | Freq: Four times a day (QID) | ORAL | Status: DC | PRN
Start: 2021-09-07 — End: 2021-09-08
  Administered 2021-09-08: 50 mg via ORAL

## 2021-09-07 MED ORDER — LIDOCAINE HCL (PF) 1 % IJ SOLN
INTRAMUSCULAR | Status: AC
  Filled 2021-09-07: qty 5

## 2021-09-07 MED ORDER — BUPIVACAINE LIPOSOME 1.3 % IJ SUSP
INTRAMUSCULAR | Status: AC
  Filled 2021-09-07: qty 10

## 2021-09-07 MED ORDER — OXYCODONE HCL 5 MG PO TABS: 5.0000 mg | ORAL_TABLET | Freq: Once | ORAL | Status: DC | PRN

## 2021-09-07 MED ORDER — ONDANSETRON HCL 4 MG/2ML IJ SOLN
INTRAMUSCULAR | Status: DC | PRN
  Administered 2021-09-07 (×2): 4 mg via INTRAVENOUS

## 2021-09-07 MED ORDER — CEFAZOLIN SODIUM-DEXTROSE 2-4 GM/100ML-% IV SOLN
INTRAVENOUS | Status: AC
  Administered 2021-09-08: 2 g via INTRAVENOUS
  Filled 2021-09-07: qty 100

## 2021-09-07 MED ORDER — EPHEDRINE SULFATE 50 MG/ML IJ SOLN
INTRAMUSCULAR | Status: DC | PRN
Start: 2021-09-07 — End: 2021-09-07
  Administered 2021-09-07 (×6): 5 mg via INTRAVENOUS

## 2021-09-07 MED ORDER — SUGAMMADEX SODIUM 200 MG/2ML IV SOLN
INTRAVENOUS | Status: DC | PRN
  Administered 2021-09-07: 200 mg via INTRAVENOUS

## 2021-09-07 MED ORDER — CHLORHEXIDINE GLUCONATE 0.12 % MT SOLN
OROMUCOSAL | Status: AC
  Filled 2021-09-07: qty 15

## 2021-09-07 MED ORDER — DOXYLAMINE SUCCINATE (SLEEP) 25 MG PO TABS
25.0000 mg | ORAL_TABLET | Freq: Every evening | ORAL | Status: DC | PRN
  Filled 2021-09-07: qty 1

## 2021-09-07 MED ORDER — CEFAZOLIN SODIUM 1 G IJ SOLR
INTRAMUSCULAR | Status: AC
  Filled 2021-09-07: qty 20

## 2021-09-07 MED ORDER — ONDANSETRON HCL 4 MG/2ML IJ SOLN
INTRAMUSCULAR | Status: AC
  Filled 2021-09-07: qty 2

## 2021-09-07 MED ORDER — VANCOMYCIN HCL 1000 MG IV SOLR
INTRAVENOUS | Status: DC | PRN
  Administered 2021-09-07: 1000 mg via TOPICAL

## 2021-09-07 MED ORDER — METHOCARBAMOL 500 MG PO TABS
ORAL_TABLET | ORAL | Status: AC
  Administered 2021-09-07: 500 mg via ORAL
  Filled 2021-09-07: qty 1

## 2021-09-07 MED ORDER — ORAL CARE MOUTH RINSE: 15.0000 mL | Freq: Once | OROMUCOSAL | Status: AC

## 2021-09-07 MED ORDER — DEXAMETHASONE SODIUM PHOSPHATE 10 MG/ML IJ SOLN
INTRAMUSCULAR | Status: DC | PRN
  Administered 2021-09-07: 6 mg via INTRAVENOUS

## 2021-09-07 MED ORDER — ACETAMINOPHEN 10 MG/ML IV SOLN
INTRAVENOUS | Status: AC
  Filled 2021-09-07: qty 100

## 2021-09-07 MED ORDER — METOCLOPRAMIDE HCL 5 MG/ML IJ SOLN: 5.0000 mg | Freq: Three times a day (TID) | INTRAMUSCULAR | Status: DC | PRN

## 2021-09-07 MED ORDER — POLYVINYL ALCOHOL 1.4 % OP SOLN
1.0000 [drp] | OPHTHALMIC | Status: DC | PRN
  Filled 2021-09-07: qty 15

## 2021-09-07 MED ORDER — ARTIFICIAL TEARS OPHTHALMIC OINT
TOPICAL_OINTMENT | Freq: Every evening | OPHTHALMIC | Status: DC | PRN
  Filled 2021-09-07: qty 3.5

## 2021-09-07 MED ORDER — CITALOPRAM HYDROBROMIDE 10 MG PO TABS
10.0000 mg | ORAL_TABLET | Freq: Every day | ORAL | Status: DC
  Administered 2021-09-07: 10 mg via ORAL
  Filled 2021-09-07 (×2): qty 1

## 2021-09-07 MED ORDER — DIPHENHYDRAMINE HCL 12.5 MG/5ML PO ELIX
12.5000 mg | ORAL_SOLUTION | ORAL | Status: DC | PRN
  Filled 2021-09-07: qty 10

## 2021-09-07 MED ORDER — PHENOL 1.4 % MT LIQD
1.0000 | OROMUCOSAL | Status: DC | PRN
  Filled 2021-09-07: qty 177

## 2021-09-07 MED ORDER — CEFAZOLIN SODIUM-DEXTROSE 2-4 GM/100ML-% IV SOLN
INTRAVENOUS | Status: AC
  Filled 2021-09-07: qty 100

## 2021-09-07 MED ORDER — BUPIVACAINE HCL (PF) 0.5 % IJ SOLN
INTRAMUSCULAR | Status: DC | PRN
  Administered 2021-09-07: 10 mL via PERINEURAL

## 2021-09-07 MED ORDER — ALUM & MAG HYDROXIDE-SIMETH 200-200-20 MG/5ML PO SUSP: 30.0000 mL | ORAL | Status: DC | PRN

## 2021-09-07 MED ORDER — LIDOCAINE HCL (PF) 1 % IJ SOLN
INTRAMUSCULAR | Status: DC | PRN
  Administered 2021-09-07: 1 mL via SUBCUTANEOUS

## 2021-09-07 MED ORDER — VITAMIN D3 25 MCG (1000 UNIT) PO TABS
1000.0000 [IU] | ORAL_TABLET | Freq: Every day | ORAL | Status: DC
  Administered 2021-09-08: 1000 [IU] via ORAL
  Filled 2021-09-07: qty 1

## 2021-09-07 MED ORDER — PROPOFOL 10 MG/ML IV BOLUS
INTRAVENOUS | Status: DC | PRN
  Administered 2021-09-07: 40 mg via INTRAVENOUS
  Administered 2021-09-07: 120 mg via INTRAVENOUS

## 2021-09-07 MED ORDER — LIDOCAINE HCL (CARDIAC) PF 100 MG/5ML IV SOSY
PREFILLED_SYRINGE | INTRAVENOUS | Status: DC | PRN
  Administered 2021-09-07: 60 mg via INTRAVENOUS
  Administered 2021-09-07: 40 mg via INTRAVENOUS

## 2021-09-07 MED ORDER — OXYCODONE HCL 5 MG PO TABS
ORAL_TABLET | ORAL | Status: AC
  Filled 2021-09-07: qty 1

## 2021-09-07 MED ORDER — ACETAMINOPHEN 10 MG/ML IV SOLN: 1000.0000 mg | Freq: Once | INTRAVENOUS | Status: DC | PRN

## 2021-09-07 MED ORDER — CEFAZOLIN SODIUM-DEXTROSE 2-4 GM/100ML-% IV SOLN
INTRAVENOUS | Status: AC
  Administered 2021-09-07: 2 g via INTRAVENOUS
  Filled 2021-09-07: qty 100

## 2021-09-07 SURGICAL SUPPLY — 89 items
BASEPLATE P2 COATD GLND 6.5X30 (Shoulder) ×1 IMPLANT
BIT DRILL GUIDE PATIENT MATCH (MISCELLANEOUS) ×1 IMPLANT
BLADE SAGITTAL WIDE XTHICK NO (BLADE) ×2 IMPLANT
BNDG ADH 1X3 SHEER STRL LF (GAUZE/BANDAGES/DRESSINGS) ×12 IMPLANT
CHLORAPREP W/TINT 26 (MISCELLANEOUS) ×4 IMPLANT
CNTNR SPEC 2.5X3XGRAD LEK (MISCELLANEOUS) ×1
CONT SPEC 4OZ STER OR WHT (MISCELLANEOUS) ×1
CONTAINER SPEC 2.5X3XGRAD LEK (MISCELLANEOUS) ×1 IMPLANT
COOLER POLAR GLACIER W/PUMP (MISCELLANEOUS) ×2 IMPLANT
COVER BACK TABLE REUSABLE LG (DRAPES) ×2 IMPLANT
DERMABOND ADVANCED (GAUZE/BANDAGES/DRESSINGS) ×1
DERMABOND ADVANCED .7 DNX12 (GAUZE/BANDAGES/DRESSINGS) ×1 IMPLANT
DRAPE 3/4 80X56 (DRAPES) ×4 IMPLANT
DRAPE IMP U-DRAPE 54X76 (DRAPES) ×4 IMPLANT
DRAPE INCISE IOBAN 66X45 STRL (DRAPES) ×4 IMPLANT
DRAPE U-SHAPE 47X51 STRL (DRAPES) ×2 IMPLANT
DRILL GUIDE PATIENT MATCH (MISCELLANEOUS) ×2
DRSG OPSITE POSTOP 3X4 (GAUZE/BANDAGES/DRESSINGS) IMPLANT
DRSG OPSITE POSTOP 4X6 (GAUZE/BANDAGES/DRESSINGS) IMPLANT
DRSG OPSITE POSTOP 4X8 (GAUZE/BANDAGES/DRESSINGS) ×2 IMPLANT
DRSG TEGADERM 2-3/8X2-3/4 SM (GAUZE/BANDAGES/DRESSINGS) ×2 IMPLANT
DRSG TEGADERM 4X10 (GAUZE/BANDAGES/DRESSINGS) IMPLANT
ELECT REM PT RETURN 9FT ADLT (ELECTROSURGICAL) ×2
ELECTRODE REM PT RTRN 9FT ADLT (ELECTROSURGICAL) ×1 IMPLANT
GAUZE 4X4 16PLY ~~LOC~~+RFID DBL (SPONGE) IMPLANT
GAUZE XEROFORM 1X8 LF (GAUZE/BANDAGES/DRESSINGS) ×2 IMPLANT
GLOVE SRG 8 PF TXTR STRL LF DI (GLOVE) ×2 IMPLANT
GLOVE SURG ORTHO LTX SZ8 (GLOVE) ×4 IMPLANT
GLOVE SURG SYN 8.0 (GLOVE) ×2 IMPLANT
GLOVE SURG UNDER POLY LF SZ8 (GLOVE) ×2
GOWN STRL REUS W/ TWL LRG LVL3 (GOWN DISPOSABLE) ×2 IMPLANT
GOWN STRL REUS W/ TWL XL LVL3 (GOWN DISPOSABLE) ×1 IMPLANT
GOWN STRL REUS W/TWL LRG LVL3 (GOWN DISPOSABLE) ×2
GOWN STRL REUS W/TWL XL LVL3 (GOWN DISPOSABLE) ×1
HEMOVAC 400CC 10FR (MISCELLANEOUS) ×2 IMPLANT
HOOD PEEL AWAY FLYTE STAYCOOL (MISCELLANEOUS) ×10 IMPLANT
INSERT SMALL SOCKET 32MM NEU (Insert) ×2 IMPLANT
IV LACTATED RINGER IRRG 3000ML (IV SOLUTION) ×1
IV LR IRRIG 3000ML ARTHROMATIC (IV SOLUTION) ×1 IMPLANT
IV SODIUM CHL 0.9% 500ML (IV SOLUTION) ×2 IMPLANT
KIT STABILIZATION SHOULDER (MISCELLANEOUS) ×2 IMPLANT
MANIFOLD NEPTUNE II (INSTRUMENTS) ×2 IMPLANT
MASK FACE SPIDER DISP (MASK) ×2 IMPLANT
MAT ABSORB  FLUID 56X50 GRAY (MISCELLANEOUS) ×2
MAT ABSORB FLUID 56X50 GRAY (MISCELLANEOUS) ×2 IMPLANT
NEEDLE MAYO 6 CRC TAPER PT (NEEDLE) ×2 IMPLANT
NEEDLE REVERSE CUT 1/2 CRC (NEEDLE) ×2 IMPLANT
NEEDLE SPNL 20GX3.5 QUINCKE YW (NEEDLE) IMPLANT
NS IRRIG 1000ML POUR BTL (IV SOLUTION) IMPLANT
P2 COATDE GLNOID BSEPLT 6.5X30 (Shoulder) ×2 IMPLANT
PACK ARTHROSCOPY SHOULDER (MISCELLANEOUS) ×2 IMPLANT
PAD WRAPON POLAR SHDR XLG (MISCELLANEOUS) ×1 IMPLANT
PASSER SUT SWANSON 36MM LOOP (INSTRUMENTS) IMPLANT
PENCIL SMOKE EVACUATOR (MISCELLANEOUS) ×2 IMPLANT
PULSAVAC PLUS IRRIG FAN TIP (DISPOSABLE) ×4
RETRIEVER SUT HEWSON (MISCELLANEOUS) IMPLANT
SCREW BONE LOCKING RSP 5.0X14 (Screw) ×2 IMPLANT
SCREW BONE RSP LOCK 5X14 (Screw) ×1 IMPLANT
SCREW BONE RSP LOCK 5X22 (Screw) ×2 IMPLANT
SCREW BONE RSP LOCKING 5.0X32 (Screw) ×4 IMPLANT
SCREW RETAIN W/HEAD 4MM OFFSET (Shoulder) ×2 IMPLANT
SLING ULTRA II LG (MISCELLANEOUS) IMPLANT
SLING ULTRA II M (MISCELLANEOUS) IMPLANT
SPONGE GAUZE 2X2 8PLY STRL LF (GAUZE/BANDAGES/DRESSINGS) ×2 IMPLANT
SPONGE T-LAP 18X18 ~~LOC~~+RFID (SPONGE) ×8 IMPLANT
STAPLER SKIN PROX 35W (STAPLE) ×2 IMPLANT
STEM HUMERAL REV SHL 6X108 SM (Stem) ×2 IMPLANT
STRAP SAFETY 5IN WIDE (MISCELLANEOUS) ×4 IMPLANT
STRIP CLOSURE SKIN 1/2X4 (GAUZE/BANDAGES/DRESSINGS) ×2 IMPLANT
SUT ETHIBOND #5 BRAIDED 30INL (SUTURE) ×2 IMPLANT
SUT ETHIBOND 2 V 37 (SUTURE) ×2 IMPLANT
SUT FIBERWIRE #2 38 BLUE 1/2 (SUTURE) ×10
SUT MNCRL AB 4-0 PS2 18 (SUTURE) IMPLANT
SUT PROLENE 6 0 P 1 18 (SUTURE) ×2 IMPLANT
SUT TICRON 2-0 30IN 311381 (SUTURE) ×6 IMPLANT
SUT TIGER TAPE 7 IN WHITE (SUTURE) ×4 IMPLANT
SUT VIC AB 0 CT1 36 (SUTURE) ×2 IMPLANT
SUT VIC AB 2-0 CT2 27 (SUTURE) ×4 IMPLANT
SUT XBRAID 1.4 BLK/WHT (SUTURE) ×2 IMPLANT
SUT XBRAID 1.4 BLUE (SUTURE) ×2 IMPLANT
SUTURE FIBERWR #2 38 BLUE 1/2 (SUTURE) ×5 IMPLANT
SYR 20ML LL LF (SYRINGE) IMPLANT
SYR 30ML LL (SYRINGE) ×2 IMPLANT
TIP FAN IRRIG PULSAVAC PLUS (DISPOSABLE) ×2 IMPLANT
TRAY FOLEY SLVR 16FR LF STAT (SET/KITS/TRAYS/PACK) IMPLANT
WATER STERILE IRR 1000ML POUR (IV SOLUTION) ×2 IMPLANT
WATER STERILE IRR 500ML POUR (IV SOLUTION) IMPLANT
WIRE CERLCAGE 1.0 280 (WIRE) ×2 IMPLANT
WRAPON POLAR PAD SHDR XLG (MISCELLANEOUS) ×2

## 2021-09-07 NOTE — Anesthesia Procedure Notes (Signed)
Procedure Name: Intubation Date/Time: 09/07/2021 7:43 AM Performed by: Lynden Oxford, CRNA Pre-anesthesia Checklist: Patient identified, Emergency Drugs available, Suction available and Patient being monitored Patient Re-evaluated:Patient Re-evaluated prior to induction Oxygen Delivery Method: Circle system utilized Preoxygenation: Pre-oxygenation with 100% oxygen Induction Type: IV induction Ventilation: Mask ventilation without difficulty Laryngoscope Size: McGraph and 3 Grade View: Grade II Tube type: Oral Tube size: 7.0 mm Number of attempts: 1 Airway Equipment and Method: Stylet and Video-laryngoscopy Placement Confirmation: ETT inserted through vocal cords under direct vision, positive ETCO2 and breath sounds checked- equal and bilateral Secured at: 21 cm Tube secured with: Tape Dental Injury: Teeth and Oropharynx as per pre-operative assessment  Difficulty Due To: Difficulty was anticipated, Difficult Airway- due to reduced neck mobility and Difficult Airway- due to limited oral opening Future Recommendations: Recommend- induction with short-acting agent, and alternative techniques readily available

## 2021-09-07 NOTE — Discharge Instructions (Signed)
Anne [name] H. Simara [name], MD  [facility]  Phone: [phone]  Fax: [phone]   Discharge Instructions after Reverse Shoulder Replacement    1. Activity/Sling: You are to be non-weight bearing on operative extremity. A sling/shoulder immobilizer has been provided for you. Only remove the sling to perform elbow, wrist, and hand RoM exercises and hygiene/dressing. Active reaching and lifting are not permitted. You will be given further instructions on sling use at your first physical therapy visit and postoperative visit with Dr. [name].   2. Dressings: Dressing may be removed at 1st physical therapy visit (~3-4 days after surgery). Afterwards, you may either leave open to air (if no drainage) or cover with dry, sterile dressing. If you have steri-strips on your wound, please do not remove them. They will fall off on their own. You may shower 5 days after surgery. Please pat incision dry. Do not rub or place any shear forces across incision. If there is drainage or any opening of incision after 5 days, please notify our offices immediately.    3. Driving:  Plan on not driving for six weeks. Please note that you are advised NOT to drive while taking narcotic pain medications as you may be impaired and unsafe to drive.   4. Medications:  - You have been provided a prescription for narcotic pain medicine (usually oxycodone). After surgery, take 1-2 narcotic tablets every 4 hours if needed for severe pain. Please start this as soon as you begin to start having pain (if you received a nerve block, start taking as soon as this wears off).  - A prescription for anti-nausea medication will be provided in case the narcotic medicine causes nausea - take 1 tablet every 6 hours only if nauseated.  - Take enteric coated aspirin 325 mg once daily for 6 weeks to prevent blood clots. Do not take aspirin if you have an aspirin sensitivity/allergy or asthma or are on an anticoagulant (blood thinner) already. If so, then  your home anticoagulant will be resume and managed - do not take aspirin. -Take tylenol 1000mg (2 Extra strength or 3 regular strength tablets) every 8 hours for pain. This will reduce the amount of narcotic medication needed. May stop tylenol when you are having minimal pain. - Take a stool softener (Colace, Dulcolax or Senakot) if you are using narcotic pain medications to help with constipation that is associated with narcotic use. - DO NOT take ANY nonsteroidal anti-inflammatory pain medications: Advil, Motrin, Ibuprofen, Aleve, Naproxen, or Naprosyn.   If you are taking prescription medication for anxiety, depression, insomnia, muscle spasm, chronic pain, or for attention deficit disorder you are advised that you are at a higher risk of adverse effects with use of narcotics post-op, including narcotic addiction/dependence, depressed breathing, death. If you use non-prescribed substances: alcohol, marijuana, cocaine, heroin, methamphetamines, etc., you are at a higher risk of adverse effects with use of narcotics post-op, including narcotic addiction/dependence, depressed breathing, death. You are advised that taking > 50 morphine milligram equivalents (MME) of narcotic pain medication per day results in twice the risk of overdose or death. For your prescription provided: oxycodone 5 mg - taking more than 6 tablets per day after the first few days of surgery.   5. Physical Therapy: 1-2 times per week for ~12 weeks. Therapy typically starts on post operative Day 3 or 4. You have been provided an order for physical therapy. The therapist will provide home exercises. Please contact our offices if this appointment has not been scheduled.      6. Work: May do light duty/desk job in approximately 2 weeks when off of narcotics, pain is well-controlled, and swelling has decreased if able to function with one arm in sling. Full work may take 6 weeks if light motions and function of both arms is required.  Lifting jobs may require 12 weeks.   7. Post-Op Appointments: Your first post-op appointment will be with Dr. [name] in approximately 2 weeks time.    If you find that they have not been scheduled please call the Orthopaedic Appointment front desk at [phone].                               Anne [name] H. [name], MD [facility] Phone: [phone] Fax: [phone]   REVERSE SHOULDER ARTHROPLASTY REHAB GUIDELINES   These guidelines should be tailored to individual patients based on their rehab goals, age, precautions, quality of repair, etc.  Progression should be based on patient progress and approval by the referring physician.  PHASE 1 - Day 1 through Week 2  GENERAL GUIDELINES AND PRECAUTIONS Sling wear 24/7 except during grooming and home exercises (3 to 5 times daily) Avoid shoulder extension such that the arm is posterior the frontal plane.  When patients recline, a pillow should be placed behind the upper arm and sling should be on.  They should be advised to always be able to see the elbow Avoid combined IR/ADD/EXT, such as hand behind back to prevent dislocation Avoid combined IR and ADD such as reaching across the chest to prevent dislocation No AROM No submersion in pool/water for 4 weeks No weight bearing through operative arm (as in transfers, walker use, etc.)  GOALS Maintain integrity of joint replacement; protect soft tissue healing Increase PROM for elevation to 120 and ER to 30 (will remain the goal for first 6 weeks) Optimize distal UE circulation and muscle activity (elbow, wrist and hand) Instruct in use of sling for proper fit, polar care device for ice application after HEP, signs/symptoms of infection  EXERCISES Active elbow, wrist and hand Passive forward elevation in scapular plane to 90-120 max motion; ER in scapular plane to 30 Active scapular retraction with arms resting in neutral position  CRITERIA TO PROGRESS TO  PHASE 2 Low pain (less than 3/10) with shoulder PROM Healing of incision without signs of infection Clearance by MD to advance after 2 week MD check up  PHASE 2 - 2 weeks - 6 weeks  GENERAL GUIDELINES AND PRECAUTIONS Sling may be removed while at home; worn in community without abduction pillow May use arm for light activities of daily living (such as feeding, brushing teeth, dressing.) with elbow near  the side of the body  and arm in front of the body- no active lifting of the arm May submerge in water (tub, pool, Jacuzzi, etc.) after 4 weeks Continue to avoid WBing through the operative arm Continue to avoid combined IR/EXT/ADD (hand behind the back) and IR/ADD  (reaching across chest) for dislocation precautions  GOALS  Achieve passive elevation to 120 and ER to 30  Low (less than 3/10) to no pain  Ability to fire all heads of the deltoid  EXERCISES May discontinue grip, and active elbow and wrist exercises since using the arm in ADL's  with sling removed around the home Continue passive elevation to 120 and ER to 30, both in scapular plane with arm supported on table top Add submaximal isometrics, pain free effort, for all   functional heads of deltoid (anterior, posterior, middle)  Ensure that with posterior deltoid isometric the shoulder does not move into extension and the arm remains anterior the frontal plane At 4 weeks:  begin to place arm in balanced position of 90 deg elevation in supine; when patient able to hold this position with ease, may begin reverse pendulums clockwise and counterclockwise  CRITERIA TO PROGRESS TO PHASE 3 Passive forward elevation in scapular plane to 120; passive ER in scapular plane to 30 Ability to fire isometrically all heads of the deltoid muscle without pain Ability to place and hold the arm in balanced position (90 deg elevation in supine)  PHASE 3 - 6 weeks to 3 months  GENERAL GUIDELINES AND PRECAUTIONS Discontinue use of sling Avoid  forcing end range motion in any direction to prevent dislocation  May advance use of the arm actively in ADL's without being restricted to arm by the side of the body, however, avoid heavy lifting and sports (forever!) May initiate functional IR behind the back gently NO UPPER BODY ERGOMETER   GOALS Optimize PROM for elevation and ER in scapular plane with realistic expectation that max  mobility for elevation is usually around 145-160 passively; ER 40 to 50 passively; functional IR to L1 Recover AROM to approach as close to PROM available as possible; may expect 135-150 deg active elevation; 30 deg active ER; active functional IR to L1 Establish dynamic stability of the shoulder with deltoid and periscapular muscle gradual strengthening  EXERCISES Forward elevation in scapular plane active progression: supine to incline, to vertical; short to long lever arm Balanced position long lever arm AROM Active ER/IR with arm at side Scapular retraction with light band resistance Functional IR with hand slide up back - very gentle and gradual NO UPPER BODY ERGOMETER     CRITERIA TO PROGRESS TO PHASE 4  AROM equals/approaches PROM with good mechanics for elevation   No pain  Higher level demand on shoulder than ADL functions   PHASE 4 12 months and beyond  GENERAL GUIDELINES AND PRECAUTIONS No heavy lifting and no overhead sports No heavy pushing activity Gradually increase strength of deltoid and scapular stabilizers; also the rotator cuff if present with weights not to exceed 5 lbs NO UPPER BODY ERGOMETER   GOALS  Optimize functional use of the operative UE to meet the desired demands  Gradual increase in deltoid, scapular muscle, and rotator cuff strength  Pain free functional activities   EXERCISES Add light hand weights for deltoid up to and not to exceed 3 lbs for anterior and posterior with long arm lift against gravity; elbow bent to 90 deg for abduction in scapular  plane Theraband progression for extension to hip with scapular depression/retraction Theraband progression for serratus anterior punches in supine; avoid wall, incline or prone pressups for serratus anterior End range stretching gently without forceful overpressure in all planes (elevation in scapular plane, ER in scapular plane, functional IR) with stretching done for life as part of a daily routine NO UPPER BODY ERGOMETER     CRITERIA FOR DISCHARGE FROM SKILLED PHYSICAL THERAPY  Pain free AROM for shoulder elevation (expect around 135-150)  Functional strength for all ADL's, work tasks, and hobbies approved by surgeon  Independence with home maintenance program   NOTES: 1. With proper exercise, motion, strength, and function continue to improve even after one year. 2. The complication rate after surgery is 5 - 8%. Complications include infection, fracture, heterotopic bone formation, nerve injury, instability, rotator cuff   tear, and tuberosity nonunion. Please look for clinical signs, unusual symptoms, or lack of progress with therapy and report those to Dr. Antoinetta Berrones. Prefer more communication than less.  3. The therapy plan above only serves as a guide. Please be aware of specific individualized patient instructions as written on the prescription or through discussions with the surgeon. 4. Please call Dr. Grabiela Wohlford if you have any specific questions or concerns [phone]    

## 2021-09-07 NOTE — Op Note (Signed)
Operative Note    SURGERY DATE: 09/07/2021   PRE-OP DIAGNOSIS:  1.  Right proximal humerus malunion 2.  Right glenohumeral arthritis   POST-OP DIAGNOSIS:  1.  Right proximal humerus malunion 2.  Right glenohumeral arthritis   PROCEDURES:  1. Right reverse total shoulder arthroplasty 2. Right biceps tenodesis   SURGEON: Cato Mulligan, MD  ASSISTANTS: Reche Dixon, PA   ANESTHESIA: Gen + interscalene block   ESTIMATED BLOOD LOSS: 300cc   TOTAL IV FLUIDS: see anesthesia record  IMPLANTS: [employer]: RSP 32-52m Glenoid Head w/Retaining screw; Monoblock Reverse Shoulder Baseplate with 68.8FOcentral screw; 3 locking screws into baseplate (superior, posterior, inferior); Small Shell Humeral Stem 6 x1015m Neutral Small Socket Insert;   INDICATION(S):  Anne [name] [age].o. female who sustained a proximal humerus fracture over 1 year ago.  This was treated nonsurgically and the fracture healed and posteromedial varus.  She also had significant degenerative changes present.  She had failed extensive nonoperative management consisting of activity modifications, medical management, exercises, and corticosteroid injection.  After discussion of risks, benefits, and alternatives to surgery, the patient elected to proceed with reverse shoulder arthroplasty and biceps tenodesis.   OPERATIVE FINDINGS: Proximal humerus fracture healed in posteromedial varus, biceps tendinopathy   OPERATIVE REPORT:   I identified AdRaleighn the pre-operative holding area. Informed consent was obtained and the surgical site was marked. I reviewed the risks and benefits of the proposed surgical intervention and the patient wished to proceed. An interscalene block with Exparel was administered by the Anesthesia team. The patient was transferred to the operative suite and general anesthesia was administered. The patient was placed in the beach chair position with the head of the  bed elevated approximately 45 degrees. All down side pressure points were appropriately padded. Pre-op exam under anesthesia confirmed some stiffness and crepitus. Appropriate IV antibiotics were administered. Tranexamic acid was also administered after verifying that the patient had no contraindications. The extremity was then prepped and draped in standard fashion. A time out was performed confirming the correct extremity, correct patient, and correct procedure.   We used the standard deltopectoral incision from the coracoid to ~12cm distal. We found the cephalic vein and took it laterally. We opened the deltopectoral interval widely and placed retractors under the CA ligament in the subacromial space and under the deltoid tendon at its insertion. We then abducted and internally rotated the arm and released the underlying bursa between these retractors, taking care not to damage the circumflex branch of the axillary nerve.   Next, we brought the arm back in adduction at slight forward flexion with external rotation. We opened the clavipectoral fascia lateral to the conjoint tendon.  The arm was then internally rotated, we cut the falciform ligament at approximately 1 cm of the upper portion of the pectoralis major insertion. Next we unroofed the bicipital groove. Biceps tendon was significantly erythematous and inflamed. We proceeded with a soft tissue biceps tenodesis given the pathology of the tendon.  After opening the biceps tendon sheath all the way to the supraglenoid tubercle, we performed a biceps tenodesis with two #2 TiCron sutures to the upper border of the pectoralis major. The proximal portion of the tendon was excised.   Sutures were placed at the bone tendon interface for both the greater and lesser tuberosities.  An osteotomy of the lesser tuberosity was performed.  The remainder of the anterior capsule was peeled off of  the lesser tuberosity and humeral head using Bovie electrocautery,  taking care to stay adjacent to bone.  This was released all the way around the humerus.  Next, the shoulder was dislocated by abducting and externally rotating the arm gently.  Osteophytes were removed.  The superior and posterior rotator cuff was intact.  Next, an osteotomy of the greater tuberosity was performed.  Four #5 Ethibond sutures were placed around the greater tuberosity fragments. An additional FiberTape and FiberWire was placed around the greater tuberosity to serve as cerclage and tuberosity to implant fin sutures.  An oscillating saw was used to then remove the humeral head.   A posterior retractor was used to retract the humeral shaft, exposing the glenoid.  The anterior capsule was dissected free from the subscapularis and it was excised.  We then grasped the labrum and remnant of the biceps tendon and removed it circumferentially. During the glenoid exposure, the axillary nerve was protected the entire time.  We circumferentially released the capsule off of the glenoid and placed appropriate retractors to gain adequate visualization.  The central guidepin was drilled with a 10 degree inferior tilt. A cannulated tap was utilized bicortically. An appropriately sized reamer was used to ream the glenoid. The monoblock baseplate was inserted and appropriate fixation was achieved. The peripheral screws were drilled, measured, and placed. A 32-4 glenosphere with screw was placed.    We then turned our attention to the humerus. We sequentially used larger diameter canal finders until we met some resistance. We broached to the size indicated above at 30 degrees of retroversion.  This achieved excellent diaphyseal press-fit.  We placed a neutral poly trial.  The joint was reduced and noted to have satisfactory stability, motion, and deltoid tension with adequate tuberosity reduction.  Trial implants were removed.  We drilled two holes into the shaft on either side of the bicipital groove ~2cm  inferior to the humeral fracture line. Xbraid tape was passed through the posterior hole and placed at the superior aspect of the greater tuberosity, and another was passed through the anterior hole and placed at the superior aspect of the lesser tuberosity to serve as vertical fixation sutures.    Next, the FiberWire from the greater tuberosity fragment was passed laterally through the lateral fin of the implant and then out through the previously drilled hole into the humeral shaft lateral to the bicipital groove. The cerclage FiberTape was passed medially through the humeral stem. The wound and humeral canal were thoroughly irrigated.  The implants were then impacted into place.  However, upon doing this, a small rim of the anterior humeral shaft was fractured, compromising the fixation.  Both a cerclage FiberTape and 20-gauge wire were placed around the superior aspect of the humeral shaft in the region of the fracture.  This allowed for excellent stabilization.  Bone graft was packed into the humeral canal prior to final seating of the implant.  This achieved excellent fixation.  Bone graft was then also placed along the lateral aspect of the implant.  Prior to joint reduction, horizontal #5 Ethibond sutures were passed through the subscapularis at the bone-tendon junction. The cerclage FiberTape were also passed just medially to the #5 Ethibond sutures.  The joint was then reduced.  Next the tuberosity to fin suture was tied, followed by horizontal and cerclage sutures. Lastly, the vertical sutures were tied. An additional suture to close the lateral aspect of the rotator interval was placed. Final confirmation of motion, tension, and stability  were satisfactory. The shoulder and tuberosity fragments moved as one unit. The wound was thoroughly irrigated. Vancomycin powder was placed.  A Hemovac drain was placed.    We closed the deltopectoral interval with a running, 0-Vicryl suture.  Of note, the  cephalic vein was coagulated as it was nicked during instrumentation and cannot be repaired with 6-0 Prolene sutures.  The skin was closed with 2-0 Vicryl and 4-0 Monocryl with Dermabond. Xeroform and Honeycomb dressing were applied. A PolarCare unit and sling were placed. Patient was extubated, transferred to a stretcher bed and to the post anesthesia care unit in stable condition.    Of note, assistance from a PA was essential to performing the surgery.  PA was present for the entire surgery.  PA assisted with patient positioning, retraction, instrumentation, and wound closure. The surgery would have been more difficult and had longer operative time without PA assistance.    Additionally, this case had significantly increased complexity and surgical time compared to standard reverse shoulder arthroplasty.  This was due to this being a prior healed proximal humerus fracture.  This caused significantly distorted anatomy which required more careful and meticulous dissection, adding to surgical time.  Additionally, lesser and greater tuberosity repair required multiple sutures to be placed and tied.  Both of these factors added approximately 60 minutes to the surgical time compared to that for a standard reverse shoulder arthroplasty.   POSTOPERATIVE PLAN: Operative arm to remain in sling and abduction pillow with arm at neutral at all times except RoM exercises and hygiene. Can perform pendulums, elbow/wrist/hand RoM exercises. Passive RoM allowed to 90 FF and 30 ER. ASA for DVT ppx starting on POD#1. Patient to return to clinic in ~2 weeks for post-operative appointment.

## 2021-09-07 NOTE — Anesthesia Preprocedure Evaluation (Addendum)
Anesthesia Evaluation  Patient identified by MRN, date of birth, ID band Patient awake    Reviewed: Allergy & Precautions, NPO status , Patient's Chart, lab work & pertinent test results  History of Anesthesia Complications Negative for: history of anesthetic complications  Airway Mallampati: III  TM Distance: >3 FB Neck ROM: Full    Dental no notable dental hx.    Pulmonary neg sleep apnea, neg COPD, Not current smoker,    Pulmonary exam normal breath sounds clear to auscultation       Cardiovascular Exercise Tolerance: Good hypertension, Pt. on medications (-) Past MI and (-) CHF Normal cardiovascular exam(-) dysrhythmias (-) Valvular Problems/Murmurs Rhythm:Regular Rate:Normal     Neuro/Psych neg Seizures "Insomnia"   GI/Hepatic Neg liver ROS, neg GERD  ,  Endo/Other  neg diabetes  Renal/GU negative Renal ROS     Musculoskeletal  (+) Arthritis ,   Abdominal Normal abdominal exam  (+)   Peds  Hematology   Anesthesia Other Findings   Reproductive/Obstetrics                            Anesthesia Physical  Anesthesia Plan  ASA: 2  Anesthesia Plan: General   Post-op Pain Management: GA combined w/ Regional for post-op pain   Induction: Intravenous  PONV Risk Score and Plan: 3 and Ondansetron, Dexamethasone and Propofol infusion  Airway Management Planned: Oral ETT  Additional Equipment:   Intra-op Plan:   Post-operative Plan: Extubation in OR  Informed Consent: I have reviewed the patients History and Physical, chart, labs and discussed the procedure including the risks, benefits and alternatives for the proposed anesthesia with the patient or authorized representative who has indicated his/her understanding and acceptance.     Dental advisory given  Plan Discussed with: Anesthesiologist and CRNA  Anesthesia Plan Comments:        Anesthesia Quick Evaluation

## 2021-09-07 NOTE — H&P (Signed)
Paper H&P to be scanned into permanent record. H&P reviewed. No significant changes noted.  

## 2021-09-07 NOTE — Transfer of Care (Signed)
Immediate Anesthesia Transfer of Care Note  Patient: Anne [name]  Procedure(s) Performed: Right reverse shoulder arthroplasty, biceps tenodesis (Right: Shoulder)  Patient Location: PACU  Anesthesia Type:General  Level of Consciousness: drowsy  Airway & Oxygen Therapy: Patient Spontanous Breathing and Patient connected to face mask oxygen  Post-op Assessment: Report given to RN and Post -op Vital signs reviewed and stable  Post vital signs: Reviewed and stable  Last Vitals:  Vitals Value Taken Time  BP 105/49 09/07/21 1300  Temp    Pulse 113 09/07/21 1303  Resp 22 09/07/21 1303  SpO2 100 % 09/07/21 1303  Vitals shown include unvalidated device data.  Last Pain:  Vitals:   09/07/21 0710  TempSrc:   PainSc: 3       Patients Stated Pain Goal: 0 (09/07/21 0702)  Complications: No notable events documented.
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
## 2021-09-07 NOTE — Anesthesia Procedure Notes (Signed)
Anesthesia Regional Block: Interscalene brachial plexus block   Pre-Anesthetic Checklist: , timeout performed,  Correct Patient, Correct Site, Correct Laterality,  Correct Procedure, Correct Position, site marked,  Risks and benefits discussed,  Surgical consent,  Pre-op evaluation,  At surgeon's request and post-op pain management  Laterality: Upper and Right  Prep: chloraprep       Needles:  Injection technique: Single-shot  Needle Type: Stimiplex     Needle Length: 5cm  Needle Gauge: 22     Additional Needles:   Procedures:,,,, ultrasound used (permanent image in chart),,    Narrative:  Start time: 09/07/2021 7:13 AM End time: 09/07/2021 7:15 AM Injection made incrementally with aspirations every 20 mL.  Performed by: Personally  Anesthesiologist: Foye Deer, MD  Additional Notes: Patient consented for risk and benefits of nerve block including but not limited to nerve damage, failed block, bleeding and infection.  Patient voiced understanding.  Functioning IV was confirmed and monitors were applied.  Timeout done prior to procedure and prior to any sedation being given to the patient.  Patient confirmed procedure site prior to any sedation given to the patient.  A 41mm 22ga Stimuplex needle was used. Sterile prep,hand hygiene and sterile gloves were used.  Minimal sedation used for procedure.  No paresthesia endorsed by patient during the procedure.  Negative aspiration and negative test dose prior to incremental administration of local anesthetic. The patient tolerated the procedure well with no immediate complications.

## 2021-09-08 ENCOUNTER — Encounter: Payer: Self-pay | Admitting: Orthopedic Surgery

## 2021-09-08 DIAGNOSIS — S42202P Unspecified fracture of upper end of left humerus, subsequent encounter for fracture with malunion: Secondary | ICD-10-CM | POA: Diagnosis not present

## 2021-09-08 LAB — CBC
HCT: 28 % — ABNORMAL LOW (ref 36.0–46.0)
Hemoglobin: 9.5 g/dL — ABNORMAL LOW (ref 12.0–15.0)
MCH: 32.6 pg (ref 26.0–34.0)
MCHC: 33.9 g/dL (ref 30.0–36.0)
MCV: 96.2 fL (ref 80.0–100.0)
Platelets: 205 10*3/uL (ref 150–400)
RBC: 2.91 MIL/uL — ABNORMAL LOW (ref 3.87–5.11)
RDW: 13.2 % (ref 11.5–15.5)
WBC: 9.3 10*3/uL (ref 4.0–10.5)
nRBC: 0 % (ref 0.0–0.2)

## 2021-09-08 LAB — BASIC METABOLIC PANEL
Anion gap: 8 (ref 5–15)
BUN: 17 mg/dL (ref 8–23)
CO2: 24 mmol/L (ref 22–32)
Calcium: 7.8 mg/dL — ABNORMAL LOW (ref 8.9–10.3)
Chloride: 99 mmol/L (ref 98–111)
Creatinine, Ser: 0.78 mg/dL (ref 0.44–1.00)
GFR, Estimated: >60 mL/min (ref >60)
Glucose, Bld: 130 mg/dL — ABNORMAL HIGH (ref 70–99)
Potassium: 4.1 mmol/L (ref 3.5–5.1)
Sodium: 131 mmol/L — ABNORMAL LOW (ref 135–145)

## 2021-09-08 MED ORDER — TRAMADOL HCL 50 MG PO TABS: 50.0000 mg | ORAL_TABLET | Freq: Four times a day (QID) | ORAL | 0 refills | Status: DC | PRN

## 2021-09-08 MED ORDER — ONDANSETRON HCL 4 MG PO TABS: 4.0000 mg | ORAL_TABLET | Freq: Four times a day (QID) | ORAL | 0 refills | Status: DC | PRN

## 2021-09-08 MED ORDER — ASPIRIN EC 325 MG PO TBEC
DELAYED_RELEASE_TABLET | ORAL | Status: AC
  Administered 2021-09-08: 325 mg via ORAL
  Filled 2021-09-08: qty 1

## 2021-09-08 MED ORDER — OXYCODONE HCL 5 MG PO TABS: 2.5000 mg | ORAL_TABLET | ORAL | 0 refills | Status: DC | PRN

## 2021-09-08 MED ORDER — TRAMADOL HCL 50 MG PO TABS
ORAL_TABLET | ORAL | Status: AC
  Filled 2021-09-08: qty 1

## 2021-09-08 MED ORDER — ASPIRIN 325 MG PO TBEC: 325.0000 mg | DELAYED_RELEASE_TABLET | Freq: Every day | ORAL | 0 refills | Status: AC
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
## 2021-09-08 NOTE — Anesthesia Postprocedure Evaluation (Signed)
Anesthesia Post Note  Patient: Anne [name]  Procedure(s) Performed: Right reverse shoulder arthroplasty, biceps tenodesis (Right: Shoulder)  Patient location during evaluation: PACU Anesthesia Type: General Level of consciousness: awake and alert Pain management: pain level controlled Vital Signs Assessment: post-procedure vital signs reviewed and stable Respiratory status: spontaneous breathing, nonlabored ventilation, respiratory function stable and patient connected to nasal cannula oxygen Cardiovascular status: blood pressure returned to baseline and stable Postop Assessment: no apparent nausea or vomiting Anesthetic complications: no   No notable events documented.   Last Vitals:  Vitals:   09/08/21 0000 09/08/21 0400  BP:    Pulse: 97 84  Resp: 16 16  Temp: (!) 36 C (!) 36.2 C  SpO2: 96% 95%    Last Pain:  Vitals:   09/08/21 0400  TempSrc: Temporal  PainSc:                  Lenard Simmer

## 2021-09-08 NOTE — Progress Notes (Signed)
  Subjective: 1 Day Post-Op Procedure(s) (LRB): Right reverse shoulder arthroplasty, biceps tenodesis (Right) Patient reports pain as mild.   Patient is well, and has had no acute complaints or problems Plan is to go Home after hospital stay. Negative for chest pain and shortness of breath Fever: no Gastrointestinal: Negative for nausea and vomiting  Objective: Vital signs in last 24 hours: Temp:  [96.8 F (36 C)-97.7 F (36.5 C)] 97.2 F (36.2 C) (11/01 0400) Pulse Rate:  [83-123] 84 (11/01 0400) Resp:  [15-27] 16 (11/01 0400) BP: (105-153)/(49-79) 123/63 (10/31 1755) SpO2:  [90 %-100 %] 95 % (11/01 0400)  Intake/Output from previous day:  Intake/Output Summary (Last 24 hours) at 09/08/2021 0710 Last data filed at 09/08/2021 0600 Gross per 24 hour  Intake 2894.1 ml  Output 1020 ml  Net 1874.1 ml    Intake/Output this shift: No intake/output data recorded.  Labs: Recent Labs    09/08/21 0411  HGB 9.5*   Recent Labs    09/08/21 0411  WBC 9.3  RBC 2.91*  HCT 28.0*  PLT 205   Recent Labs    09/08/21 0411  NA 131*  K 4.1  CL 99  CO2 24  BUN 17  CREATININE 0.78  GLUCOSE 130*  CALCIUM 7.8*   No results for input(s): LABPT, INR in the last 72 hours.   EXAM General - Patient is Alert and Oriented Extremity - Neurovascular intact Sensation intact distally Dorsiflexion/Plantar flexion intact Dressing/Incision - clean, dry, with the Hemovac intact.  The Hemovac was removed with no complication. Motor Function - intact, moving wrist and fingers as well as shoulder well on exam.  The patient is able to give posterior pressure with no complication.  Past Medical History:  Diagnosis Date   Arthritis    osteoarthritis   GERD (gastroesophageal reflux disease)    Hypertension    Lumbar radiculopathy 04/2021    Assessment/Plan: 1 Day Post-Op Procedure(s) (LRB): Right reverse shoulder arthroplasty, biceps tenodesis (Right) Active Problems:   S/p reverse  total shoulder arthroplasty  Estimated body mass index is 23.3 kg/m as calculated from the following:   Height as of this encounter: 5\' 5"  (1.651 m).   Weight as of this encounter: 63.5 kg. Advance diet Up with therapy D/C IV fluids  Discharge home with outpatient physical therapy.  Discharge planning.  Discharge after occupational therapy today.  DVT Prophylaxis - Aspirin Shoulder immobilizer to right shoulder  , PA-C Orthopaedic Surgery 09/08/2021, 7:10 AM

## 2021-09-08 NOTE — Discharge Summary (Signed)
Physician Discharge Summary  Subjective: 1 Day Post-Op Procedure(s) (LRB): Right reverse shoulder arthroplasty, biceps tenodesis (Right) Patient reports pain as mild.   Patient seen in rounds with Dr. Allena Katz. Patient is well, and has had no acute complaints or problems Patient is ready to go home after occupational therapy this morning.  Physician Discharge Summary  Patient ID: Anne [name] MRN: [id number] DOB/AGE: [birth year] 80 y.o.  Admit date: 09/07/2021 Discharge date: 09/08/2021  Admission Diagnoses:  Discharge Diagnoses:  Active Problems:   S/p reverse total shoulder arthroplasty   Discharged Condition: good  Hospital Course: The patient is postop day 1 from a right reverse total shoulder arthroplasty.  She is doing well postoperatively.  She has gone up to the bathroom several times.  Her pain level is manageable.  Her vitals have remained stable.  Treatments: surgery:  1. Right reverse total shoulder arthroplasty 2. Right biceps tenodesis   SURGEON: Rosealee Albee, MD   ASSISTANTS: Dedra Skeens, PA   ANESTHESIA: Gen + interscalene block   ESTIMATED BLOOD LOSS: 300cc   TOTAL IV FLUIDS: see anesthesia record   IMPLANTS: [employer] Surgical: RSP 32-77mm Glenoid Head w/Retaining screw; Monoblock Reverse Shoulder Baseplate with 6.59mm central screw; 3 locking screws into baseplate (superior, posterior, inferior); Small Shell Humeral Stem 6 x136mm; Neutral Small Socket Insert;  Discharge Exam: Blood pressure 123/63, pulse 84, temperature (!) 97.2 F (36.2 C), temperature source Temporal, resp. rate 16, height 5\' 5"  (1.651 m), weight 63.5 kg, SpO2 95 %.   Disposition:    Allergies as of 09/08/2021       Reactions   Other    Neoprene - causes irritation, itching, swelling        Medication List     TAKE these medications    acetaminophen 325 MG tablet Commonly known as: TYLENOL Take 2 tablets (650 mg total) by mouth every 4 (four) hours as needed  for mild pain ((score 1 to 3) or temp > 100.5).   amLODipine 2.5 MG tablet Commonly known as: NORVASC Take 2.5 mg by mouth daily.   aspirin 325 MG EC tablet Take 1 tablet (325 mg total) by mouth daily.   B-complex with vitamin C tablet Take 1 tablet by mouth daily.   carteolol 1 % ophthalmic solution Commonly known as: OCUPRESS Place 1 drop into both eyes 2 (two) times daily.   CENTRUM SILVER 50+WOMEN PO Take 1 tablet by mouth daily.   cholecalciferol 25 MCG (1000 UNIT) tablet Commonly known as: VITAMIN D3 Take 1,000 Units by mouth daily.   citalopram 10 MG tablet Commonly known as: CELEXA Take 10 mg by mouth at bedtime.   doxylamine (Sleep) 25 MG tablet Commonly known as: UNISOM Take 25 mg by mouth at bedtime as needed.   Fish Oil 1000 MG Caps Take 1,000 mg by mouth.   HYDROcodone-acetaminophen 5-325 MG tablet Commonly known as: NORCO/VICODIN Take 1 tablet by mouth every 4 (four) hours as needed for moderate pain ((score 4 to 6)).   ibuprofen 200 MG tablet Commonly known as: ADVIL Take 400 mg by mouth every 8 (eight) hours as needed for moderate pain.   L-Theanine 200 MG Caps Take 200 mg by mouth at bedtime.   MAGNESIUM GLYCINATE PO Take 120 mg by mouth daily.   methocarbamol 500 MG tablet Commonly known as: ROBAXIN Take 1 tablet (500 mg total) by mouth every 6 (six) hours as needed for muscle spasms.   omeprazole 20 MG capsule Commonly known as:  PRILOSEC Take 20 mg by mouth daily. With dinner   ondansetron 4 MG tablet Commonly known as: ZOFRAN Take 1 tablet (4 mg total) by mouth every 6 (six) hours as needed for nausea.   oxyCODONE 5 MG immediate release tablet Commonly known as: Oxy IR/ROXICODONE Take 0.5-1 tablets (2.5-5 mg total) by mouth every 4 (four) hours as needed for moderate pain (pain score 4-6).   Systane Balance 0.6 % Soln Generic drug: Propylene Glycol Place 1 drop into both eyes daily as needed (dry eyes).   SYSTANE NIGHTTIME  OP Place 1 application into both eyes at bedtime.   traMADol 50 MG tablet Commonly known as: ULTRAM Take 1 tablet (50 mg total) by mouth every 6 (six) hours as needed for moderate pain.        Follow-up Information     Signa Kell, MD Follow up in 2 week(s).   Specialty: Orthopedic Surgery Why: Postoperative visit Contact information: [address] [place] [address] [phone]                 Signed: Lenard Forth, Aimee Timmons 09/08/2021, 7:19 AM   Objective: Vital signs in last 24 hours: Temp:  [96.8 F (36 C)-97.7 F (36.5 C)] 97.2 F (36.2 C) (11/01 0400) Pulse Rate:  [83-123] 84 (11/01 0400) Resp:  [15-27] 16 (11/01 0400) BP: (105-153)/(49-71) 123/63 (10/31 1755) SpO2:  [90 %-100 %] 95 % (11/01 0400)  Intake/Output from previous day:  Intake/Output Summary (Last 24 hours) at 09/08/2021 0719 Last data filed at 09/08/2021 0600 Gross per 24 hour  Intake 2894.1 ml  Output 1020 ml  Net 1874.1 ml    Intake/Output this shift: No intake/output data recorded.  Labs: Recent Labs    09/08/21 0411  HGB 9.5*   Recent Labs    09/08/21 0411  WBC 9.3  RBC 2.91*  HCT 28.0*  PLT 205   Recent Labs    09/08/21 0411  NA 131*  K 4.1  CL 99  CO2 24  BUN 17  CREATININE 0.78  GLUCOSE 130*  CALCIUM 7.8*   No results for input(s): LABPT, INR in the last 72 hours.  EXAM: General - Patient is Alert and Oriented Extremity - Neurovascular intact Sensation intact distally Dorsiflexion/Plantar flexion intact Incision - clean, dry, with a Hemovac removed with no complication. Motor Function -grip strength is intact.  Able to dorsiflex and volar flex the wrist and fingers.  Able to give posterior pressure of the elbow.  Assessment/Plan: 1 Day Post-Op Procedure(s) (LRB): Right reverse shoulder arthroplasty, biceps tenodesis (Right) Procedure(s) (LRB): Right reverse shoulder arthroplasty, biceps tenodesis (Right) Past Medical History:  Diagnosis Date    Arthritis    osteoarthritis   GERD (gastroesophageal reflux disease)    Hypertension    Lumbar radiculopathy 04/2021   Active Problems:   S/p reverse total shoulder arthroplasty  Estimated body mass index is 23.3 kg/m as calculated from the following:   Height as of this encounter: 5\' 5"  (1.651 m).   Weight as of this encounter: 63.5 kg. Advance diet Up with therapy D/C IV fluids Diet - Regular diet Follow up - in 2 weeks Activity -shoulder immobilizer on at all times except for bathing and grooming Disposition - Home Condition Upon Discharge - Good DVT Prophylaxis - Aspirin  , PA-C Orthopaedic Surgery 09/08/2021, 7:19 AM

## 2021-09-09 LAB — SURGICAL PATHOLOGY

## 2022-02-20 IMAGING — US US THYROID
1 series · 13 of 25 positions shown · non-contrast
Comparison: None.

CLINICAL DATA: Goiter.  Thyroid nodule and history of thyromegaly.

EXAM:
THYROID ULTRASOUND
TECHNIQUE: Ultrasound examination of the thyroid gland and adjacent soft
tissues was performed.

[Series 1: us thyroid · 53 acquisitions, 13 frames shown]
[im 1/53]
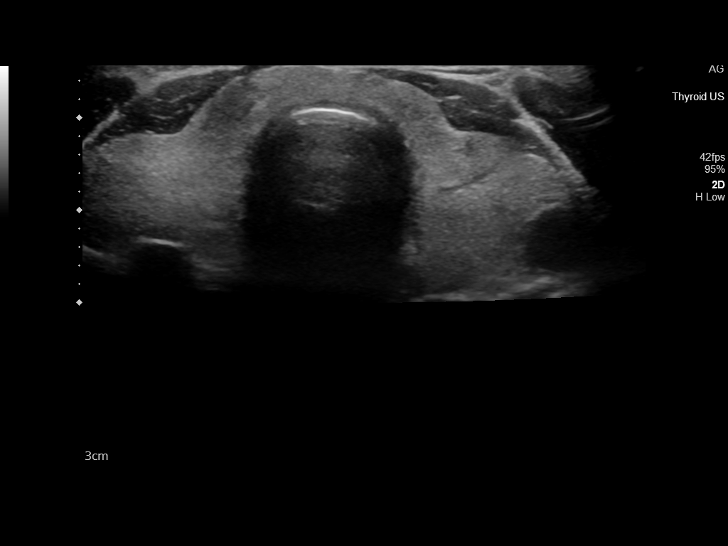
[im 5/53]
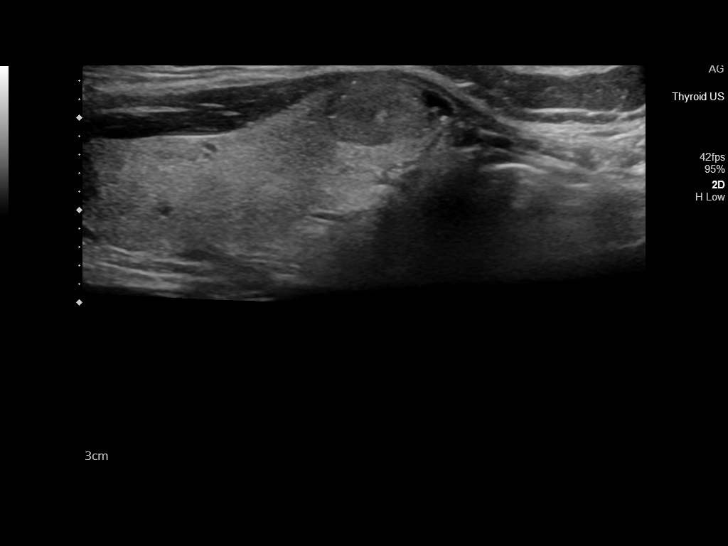
[im 9/53]
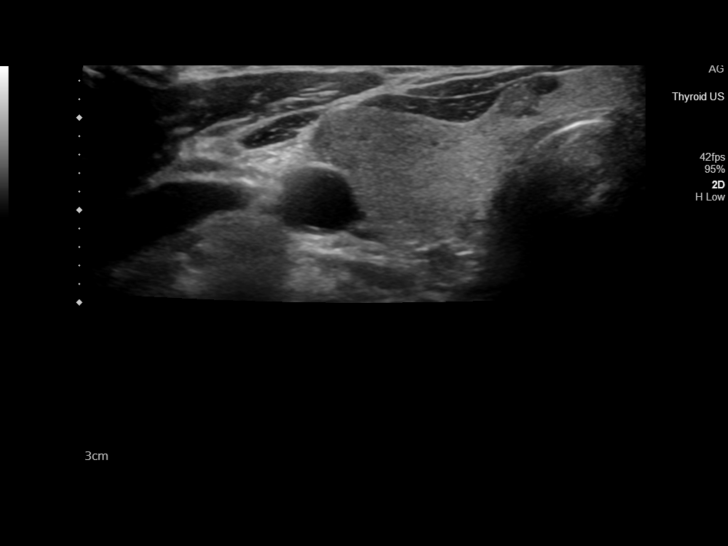
[im 14/53]
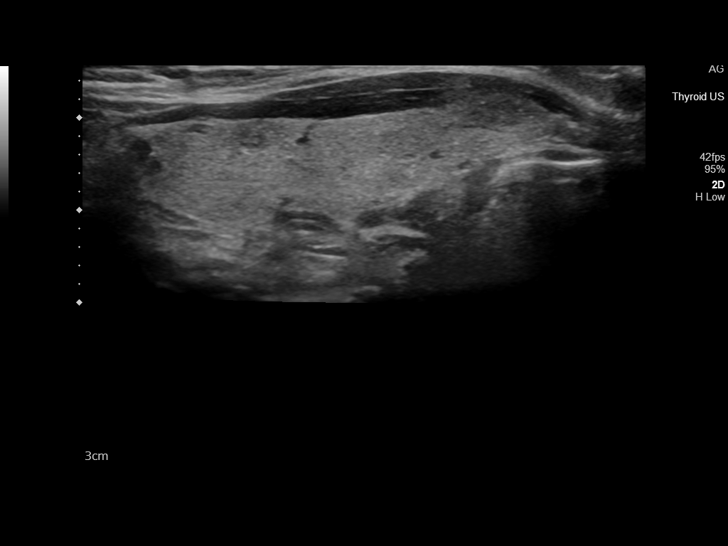
[im 18/53]
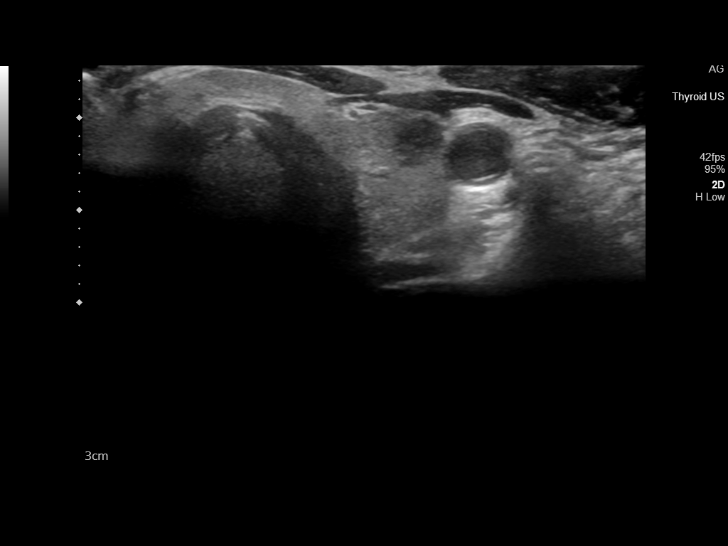
[im 22/53]
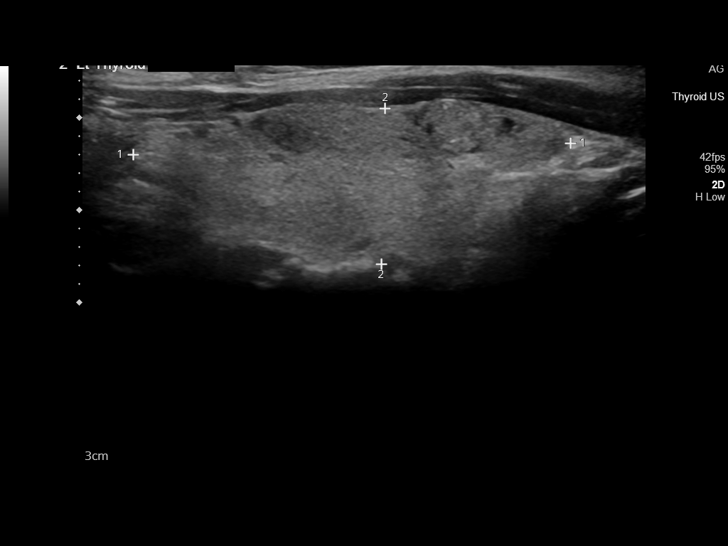
[im 27/53]
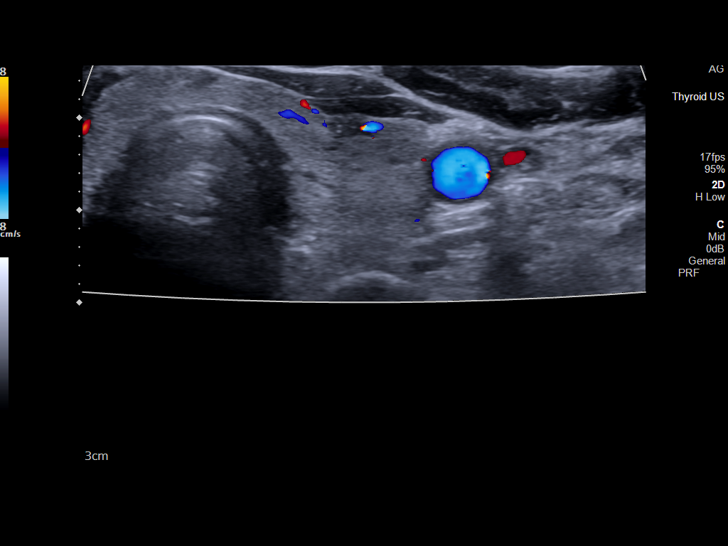
[im 31/53]
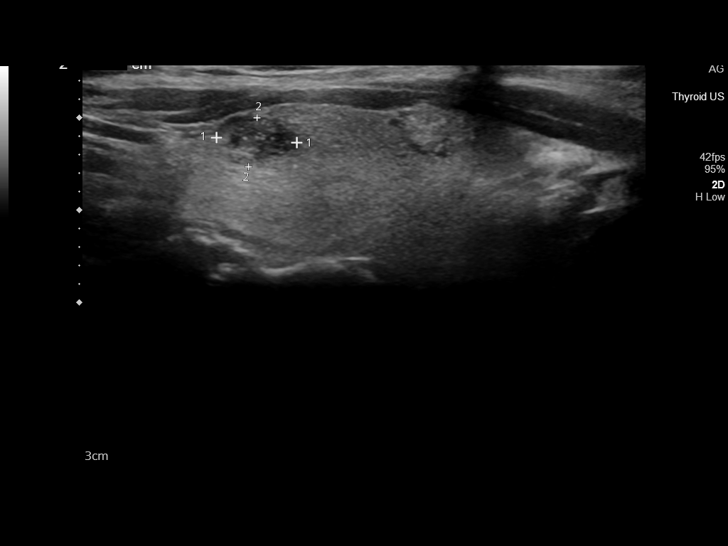
[im 35/53]
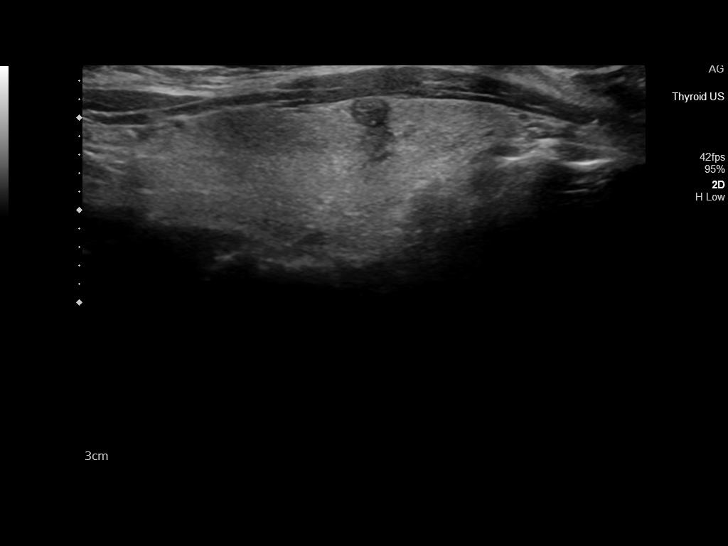
[im 40/53]
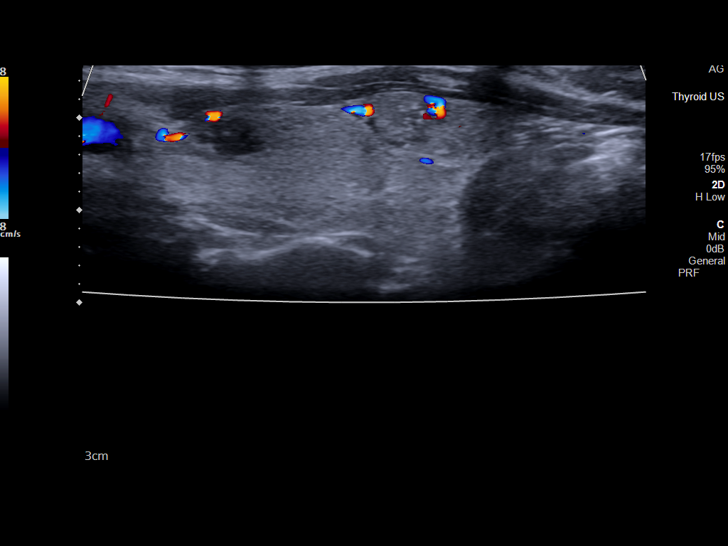
[im 44/53]
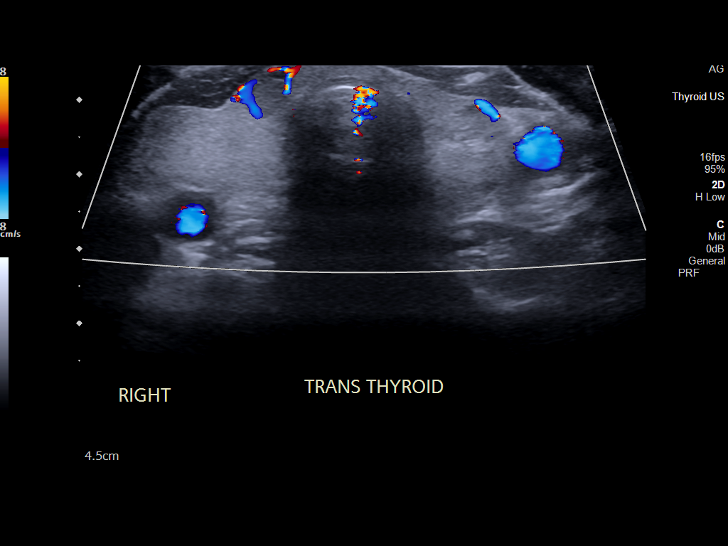
[im 48/53]
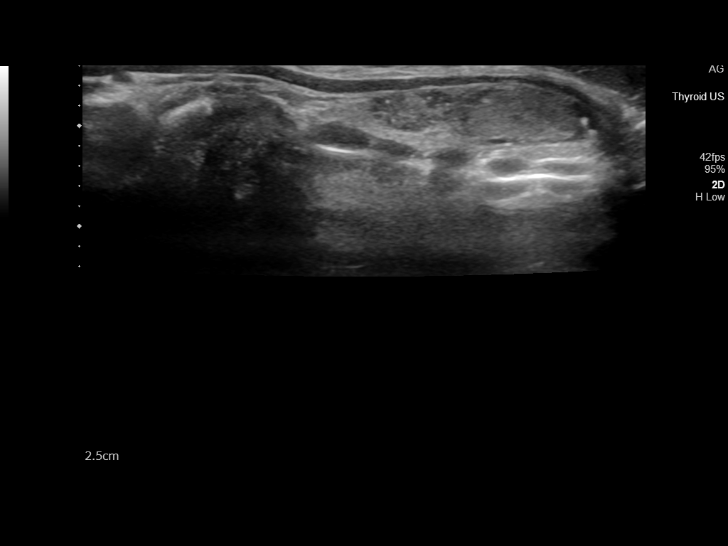
[im 53/53]
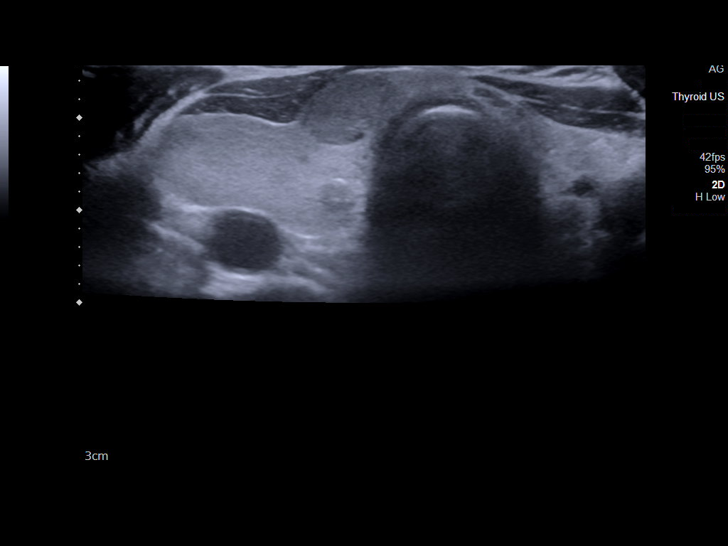

[13 of 25 positions shown; findings below may reference images not displayed]

FINDINGS: Parenchymal Echotexture: Mildly heterogenous

Isthmus: 0.4 cm

Right lobe: 5.2 x 1.5 x 2.8 cm

Left lobe: 4.7 x 1.7 x 1.7 cm

_________________________________________________________

Estimated total number of nodules >/= 1 cm: 1

Number of spongiform nodules >/=  2 cm not described below (TR1): 0

Number of mixed cystic and solid nodules >/= 1.5 cm not described
below (TR2): 0

_________________________________________________________

Nodule # 1:

Location: Isthmus; Mid

Maximum size: 1.3 cm; Other 2 dimensions: 0.7 x 0.8 cm

Composition: solid/almost completely solid (2)

Echogenicity: hypoechoic (2)

Shape: not taller-than-wide (0)

Margins: smooth (0)

Echogenic foci: none (0)

ACR TI-RADS total points: 4.

ACR TI-RADS risk category: TR4 (4-6 points).

ACR TI-RADS recommendations:

*Given size (>/= 1 - 1.4 cm) and appearance, a follow-up ultrasound
in 1 year should be considered based on TI-RADS criteria.

_________________________________________________________

Additional small subcentimeter thyroid cysts and nodules present
throughout the left gland. None of these meets criteria to consider
further evaluation.
IMPRESSION: 1. A 1.3 cm TI-RADS category 4 nodule in the thyroid isthmus meets
criteria for imaging surveillance. Recommend follow-up ultrasound in
1 year.

The above is in keeping with the ACR TI-RADS recommendations - [HOSPITAL] 3089;[DATE].

## 2022-05-29 IMAGING — DX DG SHOULDER 1V*L*
2 series · 3 of 3 positions shown · non-contrast
Comparison: None

CLINICAL DATA: Post reverse shoulder arthroplasty RIGHT

EXAM:
LEFT SHOULDER

[shoulder ap]
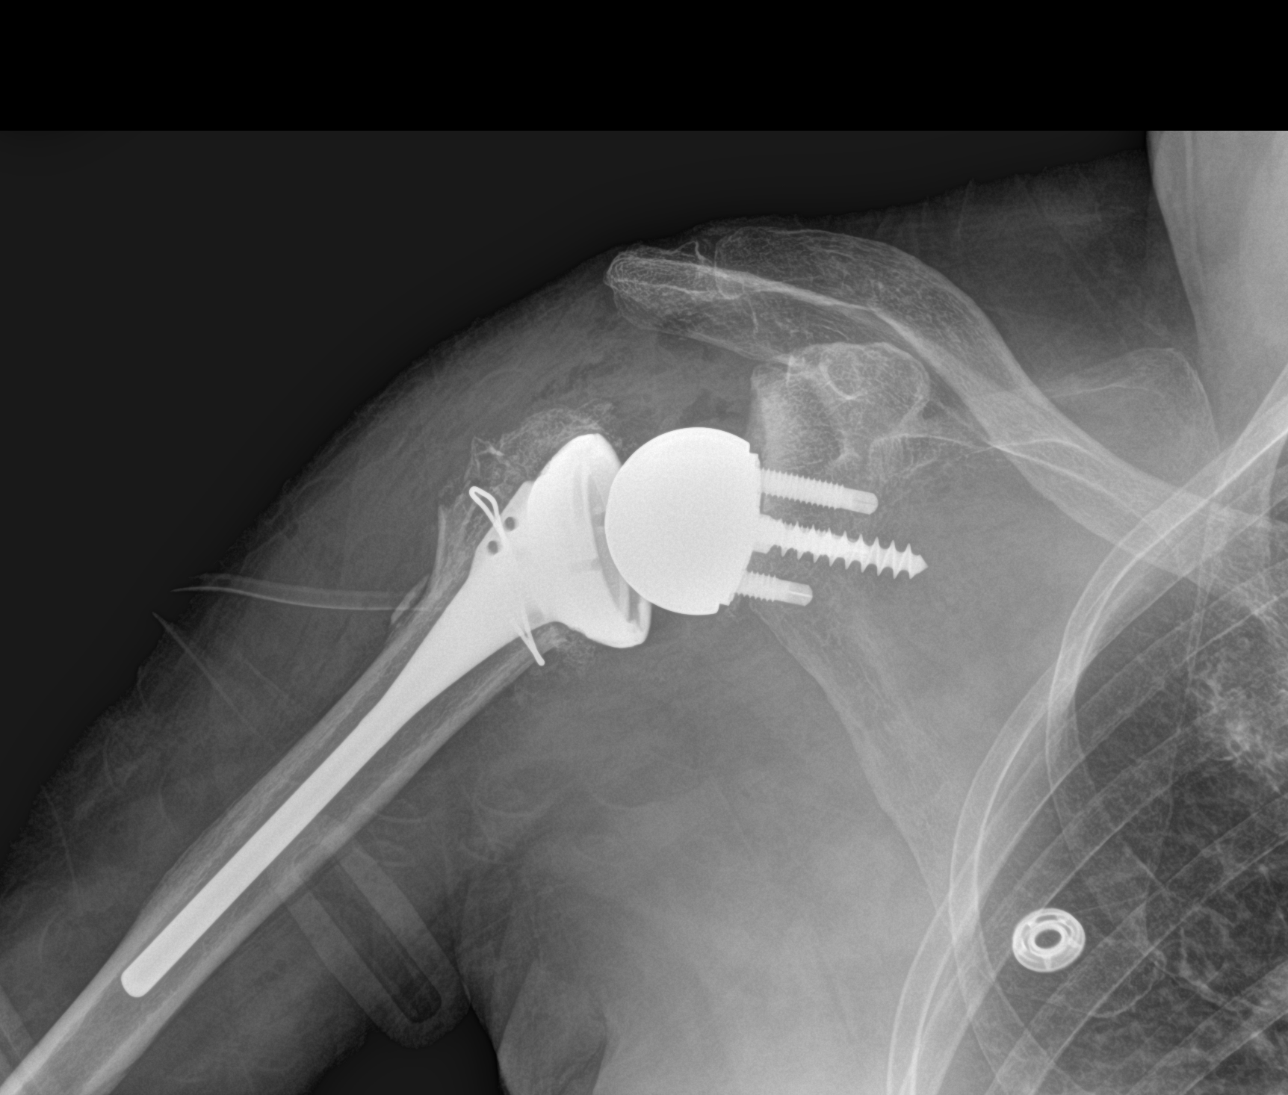

[Series 2: shoulder obl · 0.14mm/px · 2 of 2 slices shown]
[im 1/2]
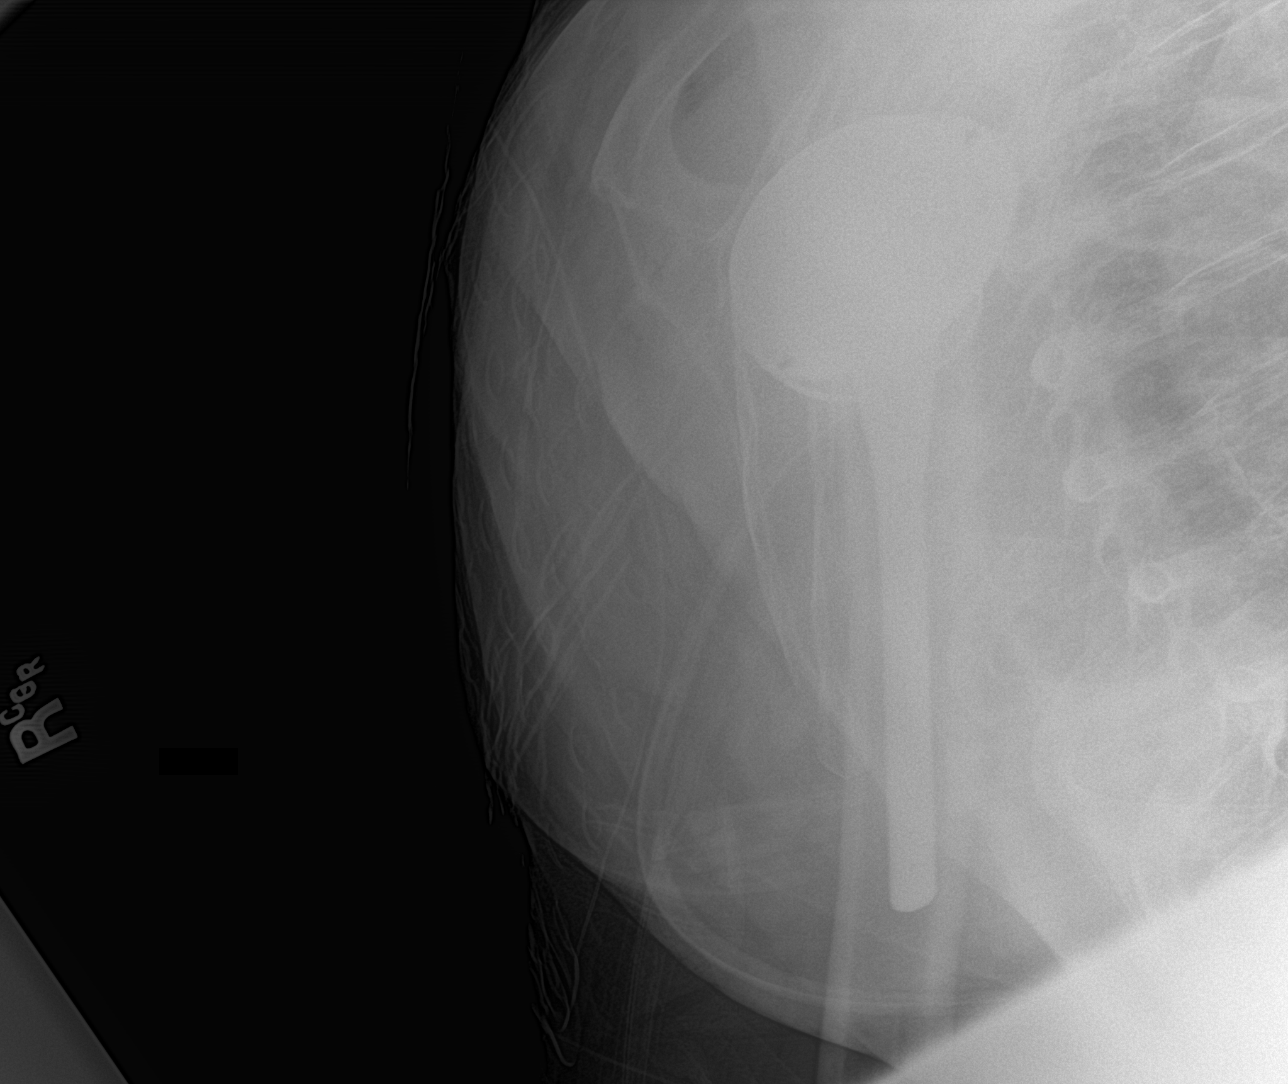
[im 2/2]
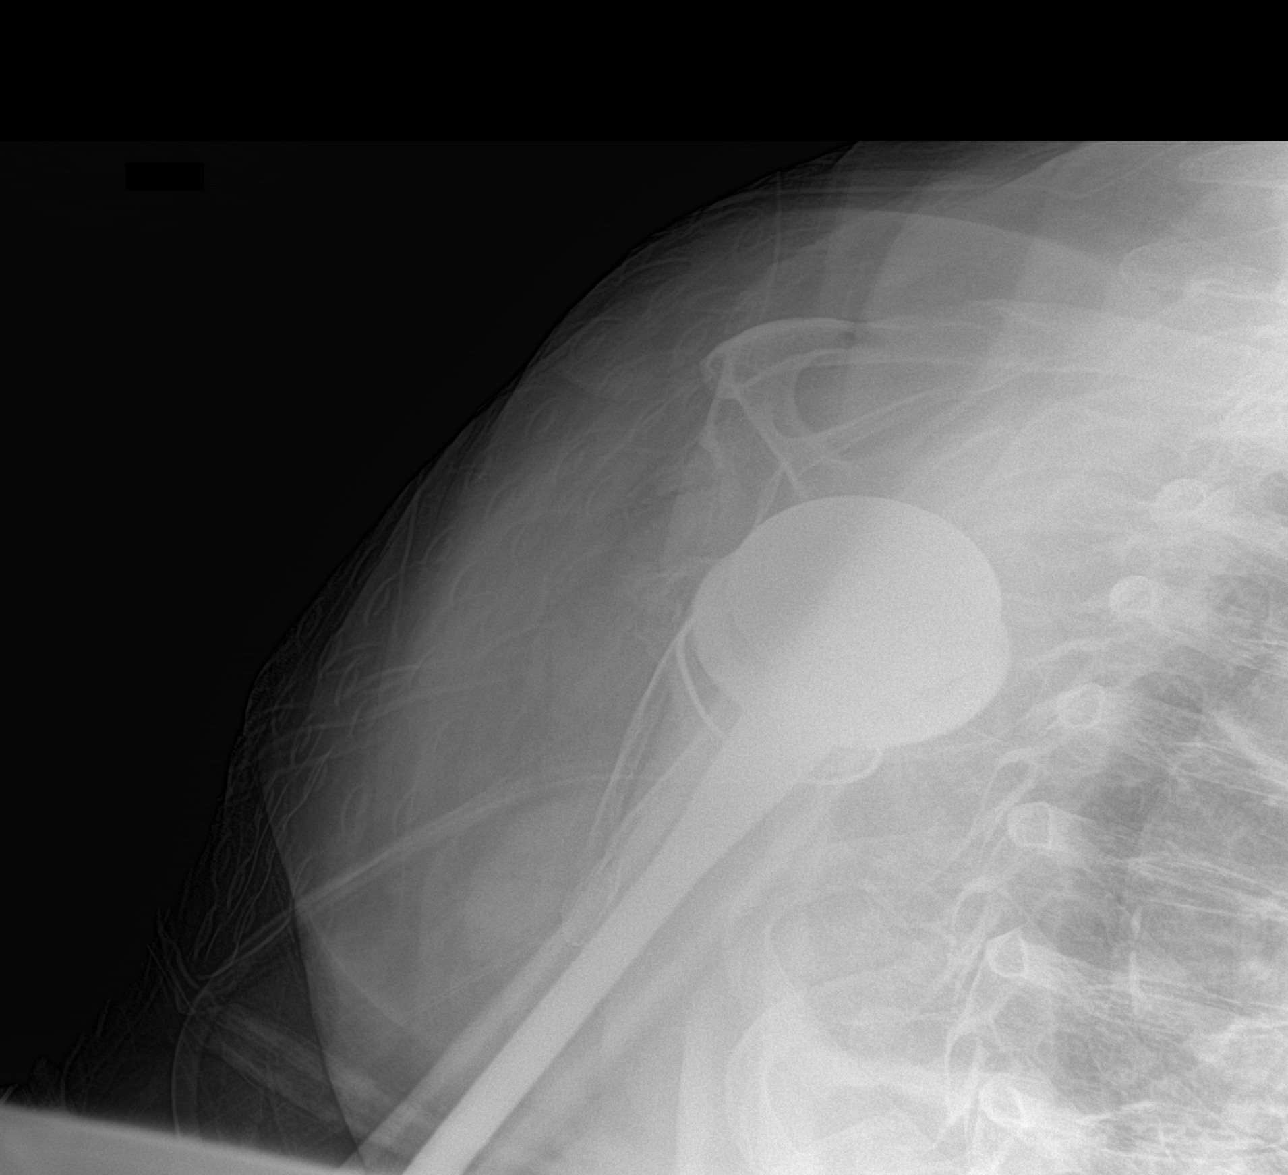

[3 of 3 positions shown; findings below may reference images not displayed]

FINDINGS: Osseous demineralization.

Components of a reverse RIGHT shoulder arthroplasty are identified.

Cerclage wire proximal humerus.

Small bone fragment adjacent to the proximal humeral metadiaphysis.

Nondisplaced fracture at the level of the cerclage wire.

No additional fracture or dislocation.

AC joint alignment normal.
IMPRESSION: RIGHT shoulder prosthesis without dislocation.

Nondisplaced fracture of the proximal humerus at the level of the
cerclage wire, noted in operative report.

## 2022-06-01 ENCOUNTER — Ambulatory Visit
Admission: RE | Admit: 2022-06-01 | Discharge: 2022-06-01 | Disposition: A | Discharge status: Home / Self Care | Payer: Medicare PPO | Source: Ambulatory Visit | Attending: Unknown Physician Specialty | Admitting: Unknown Physician Specialty

## 2022-06-01 DIAGNOSIS — E041 Nontoxic single thyroid nodule: Secondary | ICD-10-CM | POA: Diagnosis present

## 2022-06-04 ENCOUNTER — Other Ambulatory Visit: Payer: Self-pay | Admitting: Unknown Physician Specialty

## 2022-06-04 DIAGNOSIS — E041 Nontoxic single thyroid nodule: Secondary | ICD-10-CM

## 2022-11-15 ENCOUNTER — Encounter: Payer: Self-pay | Admitting: Dermatology

## 2022-11-15 ENCOUNTER — Ambulatory Visit (INDEPENDENT_AMBULATORY_CARE_PROVIDER_SITE_OTHER): Payer: Medicare HMO | Admitting: Dermatology

## 2022-11-15 VITALS — BP 149/81 | HR 75

## 2022-11-15 DIAGNOSIS — L811 Chloasma: Secondary | ICD-10-CM | POA: Diagnosis not present

## 2022-11-15 DIAGNOSIS — L578 Other skin changes due to chronic exposure to nonionizing radiation: Secondary | ICD-10-CM | POA: Diagnosis not present

## 2022-11-15 DIAGNOSIS — L719 Rosacea, unspecified: Secondary | ICD-10-CM | POA: Diagnosis not present

## 2022-11-15 NOTE — Patient Instructions (Addendum)
Will prescribe [facility] metronidazole/ivermectin/azelaic acid once or twice daily as needed to affected areas on the face. The patient was advised this is not covered by insurance since it is made by a compounding pharmacy. They will receive an email to check out and the medication will be mailed to their home.    Continue Retinol at bedtime. Stop Hydroquinone for at least next 3 months. Can use 3 months on, then 3 months off.  Recommend using Elta MD Clear moisturizing Sunscreen  Instructions for [facility] Medications  One or more of your medications was sent to the [facility] mail order compounding pharmacy. You will receive an email from them and can purchase the medicine through that link. It will then be mailed to your home at the address you confirmed. If for any reason you do not receive an email from them, please check your spam folder. If you still do not find the email, please let us know. [facility] phone number is [phone].     Recommend daily broad spectrum sunscreen SPF 30+ to sun-exposed areas, reapply every 2 hours as needed. Call for new or changing lesions.  Staying in the shade or wearing long sleeves, sun glasses (UVA+UVB protection) and wide brim hats (4-inch brim around the entire circumference of the hat) are also recommended for sun protection.     Due to recent changes in healthcare laws, you may see results of your pathology and/or laboratory studies on MyChart before the doctors have had a chance to review them. We understand that in some cases there may be results that are confusing or concerning to you. Please understand that not all results are received at the same time and often the doctors may need to interpret multiple results in order to provide you with the best plan of care or course of treatment. Therefore, we ask that you please give [place] 2 business days to thoroughly review all your results before contacting the office for  clarification. Should we see a critical lab result, you will be contacted sooner.   If You Need Anything After Your Visit  If you have any questions or concerns for your doctor, please call our main line at [phone] and press option 4 to reach your doctor's medical assistant. If no one answers, please leave a voicemail as directed and we will return your call as soon as possible. Messages left after 4 pm will be answered the following business day.   You may also send [place] a message via MyChart. We typically respond to MyChart messages within 1-2 business days.  For prescription refills, please ask your pharmacy to contact our office. Our fax number is [phone].  If you have an urgent issue when the clinic is closed that cannot wait until the next business day, you can page your doctor at the number below.    Please note that while we do our best to be available for urgent issues outside of office hours, we are not available 24/7.   If you have an urgent issue and are unable to reach [place], you may choose to seek medical care at your doctor's office, retail clinic, urgent care center, or emergency room.  If you have a medical emergency, please immediately call 911 or go to the emergency department.  Pager Numbers  - Dr. Gwen Pounds: [phone]  - Dr. Neale Burly: [phone]  - Dr. Roseanne Reno: [phone]  In the event of inclement weather, please call our main line at [phone] for an update on  the status of any delays or closures.  Dermatology Medication Tips: Please keep the boxes that topical medications come in in order to help keep track of the instructions about where and how to use these. Pharmacies typically print the medication instructions only on the boxes and not directly on the medication tubes.   If your medication is too expensive, please contact our office at [phone] option 4 or send [place] a message through [place].   We are unable to tell what your co-pay for  medications will be in advance as this is different depending on your insurance coverage. However, we may be able to find a substitute medication at lower cost or fill out paperwork to get insurance to cover a needed medication.   If a prior authorization is required to get your medication covered by your insurance company, please allow [place] 1-2 business days to complete this process.  Drug prices often vary depending on where the prescription is filled and some pharmacies may offer cheaper prices.  The website www.goodrx.com contains coupons for medications through different pharmacies. The prices here do not account for what the cost may be with help from insurance (it may be cheaper with your insurance), but the website can give you the price if you did not use any insurance.  - You can print the associated coupon and take it with your prescription to the pharmacy.  - You may also stop by our office during regular business hours and pick up a GoodRx coupon card.  - If you need your prescription sent electronically to a different pharmacy, notify our office through [facility] or by phone at [phone] option 4.     Si Usted Necesita Algo Despus de Su Visita  Tambin puede enviarnos un mensaje a travs de Pharmacist, community. Por lo general respondemos a los mensajes de MyChart en el transcurso de 1 a 2 das hbiles.  Para renovar recetas, por favor pida a su farmacia que se ponga en contacto con nuestra oficina. Harland Dingwall de fax es [place] [phone].  Si tiene un asunto urgente cuando la clnica est cerrada y que no puede esperar hasta el siguiente da hbil, puede llamar/localizar a su doctor(a) al nmero que aparece a continuacin.   Por favor, tenga en cuenta que aunque hacemos todo lo posible para estar disponibles para asuntos urgentes fuera del horario de [place], no estamos disponibles las 24 horas del da, los 7 das de la [place].   Si tiene un problema urgente y no puede  comunicarse con nosotros, puede optar por buscar atencin mdica  en el consultorio de su doctor(a), en una clnica privada, en un centro de atencin urgente o en una sala de emergencias.  Si tiene Engineering geologist, por favor llame inmediatamente al 911 o vaya a la sala de emergencias.  Nmeros de bper  - Dr. Nehemiah Massed: [phone]  - Dra. Moye: [phone]  - Dra. Nicole Kindred: [phone]  En caso de inclemencias del [place], por favor llame a Johnsie Kindred principal al [phone] para una actualizacin sobre el [place] de cualquier retraso o cierre.  Consejos para la medicacin en dermatologa: Por favor, guarde las cajas en las que vienen los medicamentos de uso tpico para ayudarle a seguir las instrucciones sobre dnde y cmo usarlos. Las farmacias generalmente imprimen las instrucciones del medicamento slo en las cajas y no directamente en los tubos del [place].   Si su medicamento es muy caro, por favor, pngase en contacto con nuestra oficina llamando al [phone]  y presione la opcin 4 o envenos un mensaje a travs de Clinical cytogeneticist.   No podemos decirle cul ser su copago por los medicamentos por adelantado ya que esto es diferente dependiendo de la cobertura de su seguro. Sin embargo, es posible que podamos encontrar un medicamento sustituto a Audiological scientist un formulario para que el seguro cubra el medicamento que se considera necesario.   Si se requiere una autorizacin previa para que su compaa de seguros [place] su medicamento, por favor permtanos de 1 a 2 das hbiles para completar [address]  Los precios de los medicamentos varan con frecuencia dependiendo del Environmental consultant de dnde se surte la receta y alguna farmacias pueden ofrecer precios ms baratos.  El sitio web www.goodrx.com tiene cupones para medicamentos de Health and safety inspector. Los precios aqu no tienen en cuenta lo que podra costar con la ayuda del seguro (puede ser ms barato con su seguro), pero  el sitio web puede darle el precio si no utiliz Tourist information centre manager.  - Puede imprimir el cupn correspondiente y llevarlo con su receta a la farmacia.  - Tambin puede pasar por nuestra oficina durante el horario de atencin regular y Education officer, museum una tarjeta de cupones de GoodRx.  - Si necesita que su receta se enve electrnicamente a una farmacia diferente, informe a nuestra oficina a travs de MyChart de [place] o por telfono llamando al [phone] y presione la opcin 4.

## 2022-11-15 NOTE — Progress Notes (Signed)
New Patient Visit  Subjective  Anne [name] is a 82 y.o. female who presents for the following: Rosacea (Face. Dermatologist in [place] has been responsible for her care. Using Azelaic acid 15% in mornings. Using hydroquinone 4% gel and retinol. Wears sunscreen daily. Has take Oracea in the October for flare, prescribed in [place]. Has also used compounds from dermatologist in [place]. HQ 8%, Tranexaminic acid, Kojic Acid, Niacinamide, Vit C and Metronidazole, Ivermectin, Clindamycin, Zinc and Niacinamide in October for flare. Uses micellar wipes then foaming cleanser by [employer]).  Review of Systems: No other skin or systemic complaints except as noted in HPI or Assessment and Plan.  Objective  Well appearing patient in no apparent distress; mood and affect are within normal limits.  A focused examination was performed including face. Relevant physical exam findings are noted in the Assessment and Plan.  face Mild erythema at face         face Reticulated hyperpigmented patches.             Assessment & Plan  Rosacea face Rosacea is a chronic progressive skin condition usually affecting the face of adults, causing redness and/or acne bumps. It is treatable but not curable. It sometimes affects the eyes (ocular rosacea) as well. It may respond to topical and/or systemic medication and can flare with stress, sun exposure, alcohol, exercise, topical steroids (including hydrocortisone/cortisone 10) and some foods.  Daily application of broad spectrum spf 30+ sunscreen to face is recommended to reduce flares.  Will prescribe [facility] metronidazole/ivermectin/azelaic acid once or twice daily as needed to affected areas on the face. The patient was advised this is not covered by insurance since it is made by a compounding pharmacy. They will receive an email to check out and the medication will be mailed to their home.    Continue Retinol at  bedtime. Recommend using Elta MD Clear moisturizing Sunscreen  Melasma face Melasma is a chronic; persistent condition of hyperpigmented patches generally on the face, worse in summer due to higher UV exposure.    Heredity; thyroid disease; sun exposure; pregnancy; birth control pills; epilepsy medication and darker skin may predispose to Melasma.   Recommendations include: - Sun avoidance and daily broad spectrum (UVA/UVB) sunscreen SPF 30+, preferably with Zinc or Titanium Dioxide. - Rx topical bleaching creams (i.e. hydroquinone) is a common treatment but should not be used long term.  Hydroquinones may be mixed with retinoids; steroids; Kojic Acid. - Rx Azelaic Acid is also a treatment option that is safe for pregnancy (Category B). - OTC Heliocare can be helpful in control and prevention. - Oral Rx with Tranexamic Acid 250 mg - 650 mg po daily can be used for moderate to severe cases especially during summer (contraindications include pregnancy; lactation; hx of PE; DVT; clotting disorder; heart disease; anticoagulant use and upcoming long trips)   - Chemical peels (would need multiple for best result).  - Lasers and  Microdermabrasion may also be helpful adjunct treatments.  Stop Hydroquinone for at least next 3 months.,  Then may restart for 3 months then off again for 3 months May continue retinol in the evening. Sunscreens recommended as above Elta MD clear stretching sunscreen.  She also likes a tinted sunscreen when she goes out.  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  - Recommend staying in the shade or wearing long sleeves, sun glasses (UVA+UVB protection) and wide brim hats (4-inch brim around the entire circumference of the hat). - Call for new or  changing lesions.  Return in about 3 months (around 02/14/2023) for Rosacea Follow Up.  I, Lawson Radar, CMA, am acting as scribe for Armida Sans, MD. Documentation: I have reviewed the above documentation for accuracy and completeness, and I agree with the above.  Armida Sans, MD

## 2022-12-04 ENCOUNTER — Encounter: Payer: Self-pay | Admitting: Dermatology

## 2022-12-09 ENCOUNTER — Ambulatory Visit (INDEPENDENT_AMBULATORY_CARE_PROVIDER_SITE_OTHER): Payer: Medicare HMO | Admitting: Dermatology

## 2022-12-09 ENCOUNTER — Encounter: Payer: Self-pay | Admitting: Dermatology

## 2022-12-09 VITALS — BP 116/68 | HR 91

## 2022-12-09 DIAGNOSIS — L811 Chloasma: Secondary | ICD-10-CM | POA: Diagnosis not present

## 2022-12-09 DIAGNOSIS — R21 Rash and other nonspecific skin eruption: Secondary | ICD-10-CM

## 2022-12-09 NOTE — Progress Notes (Signed)
   Follow-Up Visit   Subjective  Anne [name] is a 82 y.o. female who presents for the following: Rash (Patient here today concerning a cream she was prescribed that caused a rash a week and half ago.  Patient would like to discuss ipl treatment. ).  The following portions of the chart were reviewed this encounter and updated as appropriate:  Tobacco  Allergies  Meds  Problems  Med Hx  Surg Hx  Fam Hx     Review of Systems: No other skin or systemic complaints except as noted in HPI or Assessment and Plan.  Objective  Well appearing patient in no apparent distress; mood and affect are within normal limits.  A full examination was performed including scalp, head, eyes, ears, nose, lips, neck, chest, axillae, abdomen, back, buttocks, bilateral upper extremities, bilateral lower extremities, hands, feet, fingers, toes, fingernails, and toenails. All findings within normal limits unless otherwise noted below.  Head - Anterior (Face)         face Reticulated hyperpigmented patches.    Assessment & Plan  Rash 2ndary to [facility] Hydroquinine mix for Melasma Head - Anterior (Face) Patient developed rash 3 days after she started [facility]inals triple cream  Recommend patch testing to further determine sensitivity to cream treatments for rosacea and melasma Recommend avoid cream at this time to face Can continue Cerave cream  Can continue soothing cream with spf May consider oral Tranexamic Acid treatment for Melasma in future since pt is very sensitive to topical treatments.  Melasma face  Melasma is a chronic; persistent condition of hyperpigmented patches generally on the face, worse in summer due to higher UV exposure.    Heredity; thyroid disease; sun exposure; pregnancy; birth control pills; epilepsy medication and darker skin may predispose to Melasma.   Recommendations include: - Sun avoidance and daily broad spectrum (UVA/UVB) sunscreen SPF  30+, preferably with Zinc or Titanium Dioxide. - Rx topical bleaching creams (i.e. hydroquinone) is a common treatment but should not be used long term.  Hydroquinones may be mixed with retinoids; steroids; Kojic Acid. - Rx Azelaic Acid is also a treatment option that is safe for pregnancy (Category B). - OTC Heliocare can be helpful in control and prevention. - Oral Rx with Tranexamic Acid 250 mg - 650 mg po daily can be used for moderate to severe cases especially during summer (contraindications include pregnancy; lactation; hx of PE; DVT; clotting disorder; heart disease; anticoagulant use and upcoming long trips)   - Chemical peels (would need multiple for best result).  - Lasers and  Microdermabrasion may also be helpful adjunct treatments.  Patient wanted to discuss IPL treatment Avoid treatment at this time Since patient has sensitivity to multiple creams will consider Tranexamic acid for treatment in future after patch testing  No follow-ups on file.  Anne [name], CMA, am acting as scribe for Sarina Ser, MD. Documentation: I have reviewed the above documentation for accuracy and completeness, and I agree with the above.  Sarina Ser, MD

## 2022-12-09 NOTE — Patient Instructions (Addendum)
Avoid any cream treatments at this time  Continue Cerave Cream  Continue soothing cream with spf       Due to recent changes in healthcare laws, you may see results of your pathology and/or laboratory studies on MyChart before the doctors have had a chance to review them. We understand that in some cases there may be results that are confusing or concerning to you. Please understand that not all results are received at the same time and often the doctors may need to interpret multiple results in order to provide you with the best plan of care or course of treatment. Therefore, we ask that you please give [place] 2 business days to thoroughly review all your results before contacting the office for clarification. Should we see a critical lab result, you will be contacted sooner.   If You Need Anything After Your Visit  If you have any questions or concerns for your doctor, please call our main line at [phone] and press option 4 to reach your doctor's medical assistant. If no one answers, please leave a voicemail as directed and we will return your call as soon as possible. Messages left after 4 pm will be answered the following business day.   You may also send [place] a message via [place]. We typically respond to MyChart messages within 1-2 business days.  For prescription refills, please ask your pharmacy to contact our office. Our fax number is [phone].  If you have an urgent issue when the clinic is closed that cannot wait until the next business day, you can page your doctor at the number below.    Please note that while we do our best to be available for urgent issues outside of office hours, we are not available 24/7.   If you have an urgent issue and are unable to reach [place], you may choose to seek medical care at your doctor's office, retail clinic, urgent care center, or emergency room.  If you have a medical emergency, please immediately call 911 or go to the emergency  department.  Pager Numbers  - Dr. Nehemiah Massed: [phone]  - Dr. Laurence Ferrari: [phone]  - Dr. Nicole Kindred: [phone]  In the event of inclement weather, please call our main line at [phone] for an update on the status of any delays or closures.  Dermatology Medication Tips: Please keep the boxes that topical medications come in in order to help keep track of the instructions about where and how to use these. Pharmacies typically print the medication instructions only on the boxes and not directly on the medication tubes.   If your medication is too expensive, please contact our office at [phone] option 4 or send [place] a message through [place].   We are unable to tell what your co-pay for medications will be in advance as this is different depending on your insurance coverage. However, we may be able to find a substitute medication at lower cost or fill out paperwork to get insurance to cover a needed medication.   If a prior authorization is required to get your medication covered by your insurance company, please allow [place] 1-2 business days to complete this process.  Drug prices often vary depending on where the prescription is filled and some pharmacies may offer cheaper prices.  The website www.goodrx.com contains coupons for medications through different pharmacies. The prices here do not account for what the cost may be with help from insurance (it may be cheaper with your insurance), but the website can  give you the price if you did not use any insurance.  - You can print the associated coupon and take it with your prescription to the pharmacy.  - You may also stop by our office during regular business hours and pick up a GoodRx coupon card.  - If you need your prescription sent electronically to a different pharmacy, notify our office through [facility] or by phone at [phone] option 4.     Si Usted Necesita Algo Despus de Su Visita  Tambin puede enviarnos un  mensaje a travs de Pharmacist, community. Por lo general respondemos a los mensajes de MyChart en el transcurso de 1 a 2 das hbiles.  Para renovar recetas, por favor pida a su farmacia que se ponga en contacto con nuestra oficina. Harland Dingwall de fax es [place] [phone].  Si tiene un asunto urgente cuando la clnica est cerrada y que no puede esperar hasta el siguiente da hbil, puede llamar/localizar a su doctor(a) al nmero que aparece a continuacin.   Por favor, tenga en cuenta que aunque hacemos todo lo posible para estar disponibles para asuntos urgentes fuera del horario de [place], no estamos disponibles las 24 horas del da, los 7 das de la [place].   Si tiene un problema urgente y no puede comunicarse con nosotros, puede optar por buscar atencin mdica  en el consultorio de su doctor(a), en una clnica privada, en un centro de atencin urgente o en una sala de emergencias.  Si tiene Engineering geologist, por favor llame inmediatamente al 911 o vaya a la sala de emergencias.  Nmeros de bper  - Dr. Nehemiah Massed: [phone]  - Dra. Moye: [phone]  - Dra. Nicole Kindred: [phone]  En caso de inclemencias del [place], por favor llame a Johnsie Kindred principal al [phone] para una actualizacin sobre el [place] de cualquier retraso o cierre.  Consejos para la medicacin en dermatologa: Por favor, guarde las cajas en las que vienen los medicamentos de uso tpico para ayudarle a seguir las instrucciones sobre dnde y cmo usarlos. Las farmacias generalmente imprimen las instrucciones del medicamento slo en las cajas y no directamente en los tubos del [place].   Si su medicamento es muy caro, por favor, pngase en contacto con Zigmund Daniel llamando al [phone] y presione la opcin 4 o envenos un mensaje a travs de Pharmacist, community.   No podemos decirle cul ser su copago por los medicamentos por adelantado ya que esto es diferente dependiendo de la cobertura de su seguro. Sin embargo,  es posible que podamos encontrar un medicamento sustituto a Electrical engineer un formulario para que el seguro cubra el medicamento que se considera necesario.   Si se requiere una autorizacin previa para que su compaa de seguros [place] su medicamento, por favor permtanos de 1 a 2 das hbiles para completar este proceso.  Los precios de los medicamentos varan con frecuencia dependiendo del Environmental consultant de dnde se surte la receta y alguna farmacias pueden ofrecer precios ms baratos.  El sitio web www.goodrx.com tiene cupones para medicamentos de Airline pilot. Los precios aqu no tienen en cuenta lo que podra costar con la ayuda del seguro (puede ser ms barato con su seguro), pero el sitio web puede darle el precio si no utiliz Research scientist (physical sciences).  - Puede imprimir el cupn correspondiente y llevarlo con su receta a la farmacia.  - Tambin puede pasar por nuestra oficina durante el horario de atencin regular y Charity fundraiser una tarjeta de cupones de GoodRx.  -  Si necesita que su receta se enve electrnicamente a una farmacia diferente, informe a nuestra oficina a travs de MyChart de [place] o por telfono llamando al [phone] y presione la opcin 4.  

## 2022-12-13 ENCOUNTER — Encounter: Payer: Self-pay | Admitting: Dermatology

## 2022-12-13 ENCOUNTER — Ambulatory Visit: Payer: Medicare HMO

## 2022-12-13 DIAGNOSIS — R21 Rash and other nonspecific skin eruption: Secondary | ICD-10-CM

## 2022-12-13 NOTE — Progress Notes (Signed)
Patient here for day 1 patch testing placement. Patch testing was performed today using standard technique. True Test x36 applied to the upper back. Patient advised to keep panels dry until removal in 2 days for first read.

## 2022-12-14 NOTE — Addendum Note (Signed)
Addended by: Dicie Beam B on: 12/14/2022 08:31 AM   Modules accepted: Orders

## 2022-12-15 ENCOUNTER — Ambulatory Visit (INDEPENDENT_AMBULATORY_CARE_PROVIDER_SITE_OTHER): Payer: Medicare HMO

## 2022-12-15 DIAGNOSIS — R21 Rash and other nonspecific skin eruption: Secondary | ICD-10-CM

## 2022-12-15 NOTE — Progress Notes (Signed)
Patient here today for Day 3 patch test reading. Patient shows no signs of reactions. Site #28 was questionable as the adhesive was removed but redness continue to fade. Patient understands how to review testing site over the next 4 days. Patient advised do not soak nor scrub testing area. Photo taken of back.   Johnsie Kindred, RMA

## 2022-12-20 ENCOUNTER — Encounter: Payer: Self-pay | Admitting: Dermatology

## 2022-12-20 ENCOUNTER — Ambulatory Visit (INDEPENDENT_AMBULATORY_CARE_PROVIDER_SITE_OTHER): Payer: Medicare HMO | Admitting: Dermatology

## 2022-12-20 VITALS — BP 137/75 | HR 74

## 2022-12-20 DIAGNOSIS — L249 Irritant contact dermatitis, unspecified cause: Secondary | ICD-10-CM | POA: Diagnosis not present

## 2022-12-20 DIAGNOSIS — L811 Chloasma: Secondary | ICD-10-CM

## 2022-12-20 DIAGNOSIS — L719 Rosacea, unspecified: Secondary | ICD-10-CM

## 2022-12-20 MED ORDER — IVERMECTIN 1 % EX CREA: TOPICAL_CREAM | CUTANEOUS | 2 refills | Status: DC

## 2022-12-20 MED ORDER — DOXYCYCLINE 40 MG PO CPDR: DELAYED_RELEASE_CAPSULE | ORAL | 2 refills | Status: DC

## 2022-12-20 NOTE — Patient Instructions (Addendum)
Rosacea: Start Ivermectin cream pea-sized amount to face at bedtime, wash off in morning.  Start Doxycycline 40 mg once daily with food.  Melasma: Recommend OTC Inkey Tranexamic Acid cream to apply to hyperpigmented areas on face at night.  Can buy at [facility], or online for around $15. OR Alastin A-Luminante Brightening Serum.   Can resume Hydroquinone in 2 months. Use up to 3 months at a time.   Can schedule The Perfect Peel  chemical peel if desired.   Recommend daily broad spectrum sunscreen SPF 30+ to sun-exposed areas, reapply every 2 hours as needed. Call for new or changing lesions.  Staying in the shade or wearing long sleeves, sun glasses (UVA+UVB protection) and wide brim hats (4-inch brim around the entire circumference of the hat) are also recommended for sun protection.     Due to recent changes in healthcare laws, you may see results of your pathology and/or laboratory studies on MyChart before the doctors have had a chance to review them. We understand that in some cases there may be results that are confusing or concerning to you. Please understand that not all results are received at the same time and often the doctors may need to interpret multiple results in order to provide you with the best plan of care or course of treatment. Therefore, we ask that you please give [place] 2 business days to thoroughly review all your results before contacting the office for clarification. Should we see a critical lab result, you will be contacted sooner.   If You Need Anything After Your Visit  If you have any questions or concerns for your doctor, please call our main line at [phone] and press option 4 to reach your doctor's medical assistant. If no one answers, please leave a voicemail as directed and we will return your call as soon as possible. Messages left after 4 pm will be answered the following business day.   You may also send [place] a message via [place]. We typically  respond to MyChart messages within 1-2 business days.  For prescription refills, please ask your pharmacy to contact our office. Our fax number is [phone].  If you have an urgent issue when the clinic is closed that cannot wait until the next business day, you can page your doctor at the number below.    Please note that while we do our best to be available for urgent issues outside of office hours, we are not available 24/7.   If you have an urgent issue and are unable to reach [place], you may choose to seek medical care at your doctor's office, retail clinic, urgent care center, or emergency room.  If you have a medical emergency, please immediately call 911 or go to the emergency department.  Pager Numbers  - Dr. Nehemiah Massed: [phone]  - Dr. Laurence Ferrari: [phone]  - Dr. Nicole Kindred: [phone]  In the event of inclement weather, please call our main line at [phone] for an update on the status of any delays or closures.  Dermatology Medication Tips: Please keep the boxes that topical medications come in in order to help keep track of the instructions about where and how to use these. Pharmacies typically print the medication instructions only on the boxes and not directly on the medication tubes.   If your medication is too expensive, please contact our office at [phone] option 4 or send [place] a message through [place].   We are unable to tell what your co-pay for medications will  be in advance as this is different depending on your insurance coverage. However, we may be able to find a substitute medication at lower cost or fill out paperwork to get insurance to cover a needed medication.   If a prior authorization is required to get your medication covered by your insurance company, please allow [place] 1-2 business days to complete this process.  Drug prices often vary depending on where the prescription is filled and some pharmacies may offer cheaper prices.  The website  www.goodrx.com contains coupons for medications through different pharmacies. The prices here do not account for what the cost may be with help from insurance (it may be cheaper with your insurance), but the website can give you the price if you did not use any insurance.  - You can print the associated coupon and take it with your prescription to the pharmacy.  - You may also stop by our office during regular business hours and pick up a GoodRx coupon card.  - If you need your prescription sent electronically to a different pharmacy, notify our office through [facility] or by phone at [phone] option 4.     Si Usted Necesita Algo Despus de Su Visita  Tambin puede enviarnos un mensaje a travs de Pharmacist, community. Por lo general respondemos a los mensajes de MyChart en el transcurso de 1 a 2 das hbiles.  Para renovar recetas, por favor pida a su farmacia que se ponga en contacto con nuestra oficina. Harland Dingwall de fax es [place] [phone].  Si tiene un asunto urgente cuando la clnica est cerrada y que no puede esperar hasta el siguiente da hbil, puede llamar/localizar a su doctor(a) al nmero que aparece a continuacin.   Por favor, tenga en cuenta que aunque hacemos todo lo posible para estar disponibles para asuntos urgentes fuera del horario de [place], no estamos disponibles las 24 horas del da, los 7 das de la [place].   Si tiene un problema urgente y no puede comunicarse con nosotros, puede optar por buscar atencin mdica  en el consultorio de su doctor(a), en una clnica privada, en un centro de atencin urgente o en una sala de emergencias.  Si tiene Engineering geologist, por favor llame inmediatamente al 911 o vaya a la sala de emergencias.  Nmeros de bper  - Dr. Nehemiah Massed: [phone]  - Dra. Moye: [phone]  - Dra. Nicole Kindred: [phone]  En caso de inclemencias del [place], por favor llame a Johnsie Kindred principal al [phone] para una actualizacin  sobre el [place] de cualquier retraso o cierre.  Consejos para la medicacin en dermatologa: Por favor, guarde las cajas en las que vienen los medicamentos de uso tpico para ayudarle a seguir las instrucciones sobre dnde y cmo usarlos. Las farmacias generalmente imprimen las instrucciones del medicamento slo en las cajas y no directamente en los tubos del [place].   Si su medicamento es muy caro, por favor, pngase en contacto con Zigmund Daniel llamando al [phone] y presione la opcin 4 o envenos un mensaje a travs de Pharmacist, community.   No podemos decirle cul ser su copago por los medicamentos por adelantado ya que esto es diferente dependiendo de la cobertura de su seguro. Sin embargo, es posible que podamos encontrar un medicamento sustituto a Electrical engineer un formulario para que el seguro cubra el medicamento que se considera necesario.   Si se requiere una autorizacin previa para que su compaa de seguros [place] su medicamento, por favor permtanos de 1  a 2 das hbiles para completar este proceso.  Los precios de los medicamentos varan con frecuencia dependiendo del Environmental consultant de dnde se surte la receta y alguna farmacias pueden ofrecer precios ms baratos.  El sitio web www.goodrx.com tiene cupones para medicamentos de Airline pilot. Los precios aqu no tienen en cuenta lo que podra costar con la ayuda del seguro (puede ser ms barato con su seguro), pero el sitio web puede darle el precio si no utiliz Research scientist (physical sciences).  - Puede imprimir el cupn correspondiente y llevarlo con su receta a la farmacia.  - Tambin puede pasar por nuestra oficina durante el horario de atencin regular y Charity fundraiser una tarjeta de cupones de GoodRx.  - Si necesita que su receta se enve electrnicamente a una farmacia diferente, informe a nuestra oficina a travs de MyChart de [place] o por telfono llamando al [phone] y presione la opcin 4.

## 2022-12-20 NOTE — Progress Notes (Signed)
Follow-Up Visit   Subjective  Anne [name] is a 82 y.o. female who presents for the following: Rash (Here for final patch test reading. Patient states she has not noticed any reaction over the weekend) and Rosacea (Thinks is allergic to Azelaic acid).  Also f/up melasma.  Currently on break from HQ cream.    The following portions of the chart were reviewed this encounter and updated as appropriate:      Review of Systems: No other skin or systemic complaints except as noted in HPI or Assessment and Plan.   Objective  Well appearing patient in no apparent distress; mood and affect are within normal limits.  A focused examination was performed including back. Relevant physical exam findings are noted in the Assessment and Plan.  face Macular erythema at cheeks  Back Clear today. No reactions at test sites  cheeks and neck Reticulated hyperpigmented patches. Photos compared, no change   Assessment & Plan  Rosacea face  Chronic and persistent condition with duration or expected duration over one year. Condition is symptomatic/ bothersome to patient. Not currently at goal.   Rosacea is a chronic progressive skin condition usually affecting the face of adults, causing redness and/or acne bumps. It is treatable but not curable. It sometimes affects the eyes (ocular rosacea) as well. It may respond to topical and/or systemic medication and can flare with stress, sun exposure, alcohol, exercise, topical steroids (including hydrocortisone/cortisone 10) and some foods.  Daily application of broad spectrum spf 30+ sunscreen to face is recommended to reduce flares.  Start Ivermectin cream pea-sized amount to face at bedtime, wash off in morning.   Start Doxycycline ER 40 mg once daily with food.  Ivermectin 1 % CREA - face Apply to face at bedtime  doxycycline (ORACEA) 40 MG capsule - face Take 1 capsule once daily. Take with food  Irritant contact dermatitis,  unspecified trigger Back  Negative patch test today. 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 Condition is symptomatic/ bothersome to patient. Not currently at goal. Pt is currently on 3 mo break from hydroquinone cream.  Melasma is a chronic; persistent condition of hyperpigmented patches generally on the face, worse in summer due to higher UV exposure.    Heredity; thyroid disease; sun exposure; pregnancy; birth control pills; epilepsy medication and darker skin may predispose to Melasma.   Recommendations include: - Sun avoidance and daily broad spectrum (UVA/UVB) sunscreen SPF 30+, preferably with Zinc or Titanium Dioxide. - Rx topical bleaching creams (i.e. hydroquinone) is a common treatment but should not be used long term.  Hydroquinones may be mixed with retinoids; steroids; Kojic Acid. - Rx Azelaic Acid is also a treatment option that is safe for pregnancy (Category B). - OTC Heliocare can be helpful in control and prevention. - Oral Rx with Tranexamic Acid 250 mg - 650 mg po daily can be used for moderate to severe cases especially during summer (contraindications include pregnancy; lactation; hx of PE; DVT; clotting disorder; heart disease; anticoagulant use and upcoming long trips)   - Chemical peels (would need multiple for best result).  - Lasers and  Microdermabrasion may also be helpful adjunct treatments.   Recommend OTC Inkey Tranexamic Acid cream to apply to hyperpigmented areas on face at night.  Can buy at [facility], or online for around $15. OR Alastin A-Luminante Brightening Serum.  Can resume Hydroquinone in 2 months. Use up to 3 months at a time.  She will continue retinol serum qhs as tolerated Do not recommend IPL/BBL treatment- may worsen condition. Discussed The Perfect Peel. $275 per treatment. Multiple would be needed  for best results   Return in about 2 months (around 02/18/2023) for Melasma Follow Up, Rosacea Follow Up.  I, Emelia Salisbury, CMA, am acting as scribe for Brendolyn Patty, MD.  Documentation: I have reviewed the above documentation for accuracy and completeness, and I agree with the above.  Brendolyn Patty MD

## 2023-01-11 ENCOUNTER — Encounter: Payer: Self-pay | Admitting: Dermatology

## 2023-01-11 ENCOUNTER — Other Ambulatory Visit: Payer: Self-pay

## 2023-01-11 MED ORDER — DOXYCYCLINE HYCLATE 20 MG PO TABS: 20.0000 mg | ORAL_TABLET | Freq: Two times a day (BID) | ORAL | 2 refills | Status: DC

## 2023-02-16 ENCOUNTER — Ambulatory Visit: Payer: BC Managed Care – PPO | Admitting: Dermatology

## 2023-03-08 ENCOUNTER — Ambulatory Visit: Payer: BC Managed Care – PPO | Admitting: Dermatology

## 2023-03-08 ENCOUNTER — Ambulatory Visit (INDEPENDENT_AMBULATORY_CARE_PROVIDER_SITE_OTHER): Payer: Medicare HMO | Admitting: Neurosurgery

## 2023-03-08 ENCOUNTER — Encounter: Payer: Self-pay | Admitting: Neurosurgery

## 2023-03-08 VITALS — BP 138/72 | HR 76 | Ht 65.0 in | Wt 151.4 lb

## 2023-03-08 DIAGNOSIS — M545 Low back pain, unspecified: Secondary | ICD-10-CM

## 2023-03-08 NOTE — Progress Notes (Addendum)
Referring Physician:  No referring provider defined for this encounter.  Primary Physician:  Belinda Block, MD  History of Present Illness: 03/08/2023 [name] is a 82 y.o with a history of rosacea, OA, GERD, HTN who is here today with a chief complaint of aching in her low back.  This started about 2 weeks ago after attempting a new type of chair yoga.  She describes pain that starts in her low back and radiates bilaterally around to her flanks.  She denies any radiating pain, numbness, or tingling down her legs.  She states her back pain is worse with bending over, sitting, or twisting and improves with laying flat or standing and walking.  She was seen at [facility] and had an x-ray and was told that she had a old appearing compression fracture which was an interval change from her surgery in 2022.  She is currently taking Tylenol, ibuprofen, and tizanidine.  She was given a prescription for Medrol Dosepak but has not started this yet.  Conservative measures:  Physical therapy: has not participated  Multimodal medical therapy including regular antiinflammatories: ibuprofen, tylenol  Injections: has not received epidural steroid injections for this episode  Past Surgery: 04/22/21 left L3-4 microdiscectomy, left L4-S1 decompression  Anne [name] has no symptoms of cervical myelopathy.  The symptoms are causing a significant impact on the patient's life.   Review of Systems:  A 10 point review of systems is negative, except for the pertinent positives and negatives detailed in the HPI.  Past Medical History: Past Medical History:  Diagnosis Date   Arthritis    osteoarthritis   GERD (gastroesophageal reflux disease)    Hypertension    Lumbar radiculopathy 04/2021    Past Surgical History: Past Surgical History:  Procedure Laterality Date   ANKLE FRACTURE SURGERY Left    metal in ankle   APPENDECTOMY     CESAREAN SECTION      CHOLECYSTECTOMY     epidural steroid inj     EYE SURGERY Bilateral    cataract extractions   JOINT REPLACEMENT Bilateral    partial replacement on left, full replacement on right   LUMBAR LAMINECTOMY/DECOMPRESSION MICRODISCECTOMY Left 04/22/2021   Procedure: LEFT L3-4 MICRODISCECTOMY, L4-S1 LEFT DECOMPRESSION;  Surgeon: Venetia Night, MD;  Location: [facility] ORS;  Service: Neurosurgery;  Laterality: Left;   REVERSE SHOULDER ARTHROPLASTY Right 09/07/2021   Procedure: Right reverse shoulder arthroplasty, biceps tenodesis;  Surgeon: Signa Kell, MD;  Location: [facility] ORS;  Service: Orthopedics;  Laterality: Right;   TONSILLECTOMY     TRIGGER FINGER RELEASE Left    WRIST FRACTURE SURGERY Right    metal in forearm    Allergies: Allergies as of 03/08/2023 - Review Complete 12/20/2022  Allergen Reaction Noted   Other  08/24/2021    Medications: Outpatient Encounter Medications as of 03/08/2023  Medication Sig   doxycycline (PERIOSTAT) 20 MG tablet Take 1 tablet (20 mg total) by mouth 2 (two) times daily. Take with food.   acetaminophen (TYLENOL) 325 MG tablet Take 2 tablets (650 mg total) by mouth every 4 (four) hours as needed for mild pain ((score 1 to 3) or temp > 100.5).   amLODipine (NORVASC) 2.5 MG tablet Take 2.5 mg by mouth daily.   B Complex-C (B-COMPLEX WITH VITAMIN C) tablet Take 1 tablet by mouth daily.   carteolol (OCUPRESS) 1 % ophthalmic solution Place 1 drop into both eyes 2 (two) times daily.   cholecalciferol (VITAMIN D3)  25 MCG (1000 UNIT) tablet Take 1,000 Units by mouth daily.   citalopram (CELEXA) 10 MG tablet Take 10 mg by mouth at bedtime.   doxycycline (ORACEA) 40 MG capsule Take 1 capsule once daily. Take with food   doxylamine, Sleep, (UNISOM) 25 MG tablet Take 25 mg by mouth at bedtime as needed.   HYDROcodone-acetaminophen (NORCO/VICODIN) 5-325 MG tablet Take 1 tablet by mouth every 4 (four) hours as needed for moderate pain ((score 4 to 6)). (Patient not  taking: Reported on 08/24/2021)   ibuprofen (ADVIL) 200 MG tablet Take 400 mg by mouth every 8 (eight) hours as needed for moderate pain.   Ivermectin 1 % CREA Apply to face at bedtime   L-Theanine 200 MG CAPS Take 200 mg by mouth at bedtime.   MAGNESIUM GLYCINATE PO Take 120 mg by mouth daily.   methocarbamol (ROBAXIN) 500 MG tablet Take 1 tablet (500 mg total) by mouth every 6 (six) hours as needed for muscle spasms. (Patient not taking: Reported on 08/24/2021)   Multiple Vitamins-Minerals (CENTRUM SILVER 50+WOMEN PO) Take 1 tablet by mouth daily.   Omega-3 Fatty Acids (FISH OIL) 1000 MG CAPS Take 1,000 mg by mouth.   omeprazole (PRILOSEC) 20 MG capsule Take 20 mg by mouth daily. With dinner   ondansetron (ZOFRAN) 4 MG tablet Take 1 tablet (4 mg total) by mouth every 6 (six) hours as needed for nausea.   oxyCODONE (OXY IR/ROXICODONE) 5 MG immediate release tablet Take 0.5-1 tablets (2.5-5 mg total) by mouth every 4 (four) hours as needed for moderate pain (pain score 4-6).   Propylene Glycol (SYSTANE BALANCE) 0.6 % SOLN Place 1 drop into both eyes daily as needed (dry eyes).   traMADol (ULTRAM) 50 MG tablet Take 1 tablet (50 mg total) by mouth every 6 (six) hours as needed for moderate pain.   White Petrolatum-Mineral Oil (SYSTANE NIGHTTIME OP) Place 1 application into both eyes at bedtime.   No facility-administered encounter medications on file as of 03/08/2023.    Social History: Social History   Tobacco Use   Smoking status: Never   Smokeless tobacco: Never  Vaping Use   Vaping Use: Never used  Substance Use Topics   Alcohol use: Yes    Alcohol/week: 1.0 standard drink of alcohol    Types: 1 Glasses of wine per week    Comment: usually has a glass of wine with dinner   Drug use: Never    Family Medical History: No family history on file.  Physical Examination: Today's Vitals   03/08/23 1119  BP: 138/72  Pulse: 76  Weight: 68.7 kg  Height: 5\' 5"  (1.651 m)  PainSc: 9    PainLoc: Back   Body mass index is 25.19 kg/m.   General: Patient is well developed, well nourished, calm, collected, and in no apparent distress. Attention to examination is appropriate.  Psychiatric: Patient is non-anxious.  Head:  Pupils equal, round, and reactive to light.  ENT:  Oral mucosa appears well hydrated.  Neck:   Supple.  Full range of motion.  Respiratory: Patient is breathing without any difficulty.  Extremities: No edema.  Vascular: Palpable dorsal pedal pulses.  Skin:   On exposed skin, there are no abnormal skin lesions.  NEUROLOGICAL:     Awake, alert, oriented to person, place, and time.  Speech is clear and fluent. Fund of knowledge is appropriate.   Cranial Nerves: Pupils equal round and reactive to light.  Facial tone is symmetric.  Facial sensation is  symmetric.  ROM of spine: limited flex/ex due to pain.  Palpation of spine: non tender.    Strength: Side Biceps Triceps Deltoid Interossei Grip Wrist Ext. Wrist Flex.  R 5 5 5 5 5 5 5   L 5 5 5 5 5 5 5    Side Iliopsoas Quads Hamstring PF DF EHL  R 5 5 5 5 5 5   L 5 5 5 5 5 5    Reflexes are 2+ and symmetric at the biceps, triceps, brachioradialis, patella and achilles.   Hoffman's is absent.  Clonus is not present.  Toes are down-going.  Bilateral upper and lower extremity sensation is intact to light touch.    Gait is normal.    Medical Decision Making  Imaging: No recent imaging to review  Assessment and Plan: Anne [name] is a pleasant 82 y.o. female with history of lumbosacral disease who is done well until about 2 weeks ago when she had a acute onset of low back pain without radiculopathy.  We discussed that this could still be muscular in nature however given her history and the persistence of her symptoms, we will work this up further with a lumbar MRI.  In the meantime would like for her to start physical therapy and we have placed a referral to [facility] for this.  I  will see her back in 4 to 6 weeks after she is initiated physical therapy and completed her MRI to review her progress and MRI results.  I encouraged her in the meantime to pick up the prescribed Medrol Dosepak.  She was encouraged to call the office in the interim should she have any questions or concerns.  She expressed understanding and was in agreement with this plan.  Thank you for involving me in the care of this patient.   I spent a total of 30 minutes in both face-to-face and non-face-to-face activities for this visit on the date of this encounter including review of records, review of symptoms, discussion of symptoms, physical exam, discussion of differential diagnoses, and plan of care.[place] [facility]. of Neurosurgery

## 2023-03-10 ENCOUNTER — Emergency Department: Payer: Medicare HMO

## 2023-03-10 ENCOUNTER — Other Ambulatory Visit: Payer: Self-pay | Admitting: Neurosurgery

## 2023-03-10 ENCOUNTER — Other Ambulatory Visit: Payer: Self-pay

## 2023-03-10 ENCOUNTER — Encounter: Payer: Self-pay | Admitting: *Deleted

## 2023-03-10 ENCOUNTER — Emergency Department
Admission: EM | Admit: 2023-03-10 | Discharge: 2023-03-10 | Disposition: A | Discharge status: Home / Self Care | Payer: Medicare HMO | Attending: Emergency Medicine | Admitting: Emergency Medicine

## 2023-03-10 DIAGNOSIS — I10 Essential (primary) hypertension: Secondary | ICD-10-CM | POA: Diagnosis not present

## 2023-03-10 DIAGNOSIS — S32010A Wedge compression fracture of first lumbar vertebra, initial encounter for closed fracture: Secondary | ICD-10-CM | POA: Diagnosis not present

## 2023-03-10 DIAGNOSIS — S3992XA Unspecified injury of lower back, initial encounter: Secondary | ICD-10-CM | POA: Diagnosis present

## 2023-03-10 DIAGNOSIS — X58XXXA Exposure to other specified factors, initial encounter: Secondary | ICD-10-CM | POA: Insufficient documentation

## 2023-03-10 MED ORDER — HYDROCODONE-ACETAMINOPHEN 5-325 MG PO TABS: 1.0000 | ORAL_TABLET | ORAL | 0 refills | Status: DC | PRN

## 2023-03-10 MED ORDER — TRAMADOL HCL 50 MG PO TABS: 50.0000 mg | ORAL_TABLET | Freq: Three times a day (TID) | ORAL | 0 refills | Status: AC | PRN

## 2023-03-10 MED ORDER — HYDROCODONE-ACETAMINOPHEN 5-325 MG PO TABS
1.0000 | ORAL_TABLET | Freq: Once | ORAL | Status: AC
  Administered 2023-03-10: 1 via ORAL
  Filled 2023-03-10: qty 1

## 2023-03-10 MED ORDER — KETOROLAC TROMETHAMINE 30 MG/ML IJ SOLN
30.0000 mg | Freq: Once | INTRAMUSCULAR | Status: AC
  Administered 2023-03-10: 30 mg via INTRAMUSCULAR
  Filled 2023-03-10: qty 1

## 2023-03-10 NOTE — ED Notes (Signed)
TLSO back brace applied to patient by Orthopedic Representative. Instructions given to patient and understanding expressed.

## 2023-03-10 NOTE — ED Provider Notes (Signed)
[facility] Provider Note    Event Date/Time   First MD Initiated Contact with Patient 03/10/23 1714     (approximate)   History   Chief Complaint Back Pain   HPI  Anne [name] is a 82 y.o. female with past medical history of hypertension, GERD, and lumbar radiculopathy who presents to the ED complaining of back pain.  Patient reports that she has been dealing with 2 weeks of increasing pain in the middle of her lower back.  She describes the pain as sharp and worse with any movements, does not radiate down either leg.  She has not had any numbness or weakness in her legs, denies any numbness in her groin or incontinence.  She has not had any fevers and denies any dysuria or flank pain.  She reports issues with her back in the past, had lumbar fusion performed in 2022.  She saw her neurosurgeon for the symptoms a few days ago, was scheduled for outpatient MRI and prescribed tramadol for breakthrough pain but has had no relief.  She denies any recent trauma, but pain seemed to start after a yoga session.     Physical Exam   Triage Vital Signs: ED Triage Vitals  Enc Vitals Group     BP 03/10/23 1710 (!) 159/78     Pulse Rate 03/10/23 1707 79     Resp 03/10/23 1707 18     Temp 03/10/23 1707 98.5 F (36.9 C)     Temp Source 03/10/23 1707 Oral     SpO2 03/10/23 1711 96 %     Weight 03/10/23 1708 150 lb (68 kg)     Height 03/10/23 1708 5\' 5"  (1.651 m)     Head Circumference --      Peak Flow --      Pain Score 03/10/23 1708 10     Pain Loc --      Pain Edu? --      Excl. in GC? --     Most recent vital signs: Vitals:   03/10/23 1710 03/10/23 1711  BP: (!) 159/78   Pulse:    Resp:    Temp:    SpO2:  96%    Constitutional: Alert and oriented. Eyes: Conjunctivae are normal. Head: Atraumatic. Nose: No congestion/rhinnorhea. Mouth/Throat: Mucous membranes are moist.  Cardiovascular: Normal rate, regular rhythm. Grossly normal  heart sounds.  2+ radial and DP pulses bilaterally. Respiratory: Normal respiratory effort.  No retractions. Lungs CTAB. Gastrointestinal: Soft and nontender. No distention. Musculoskeletal: No lower extremity tenderness nor edema.  Midline lumbar spinal tenderness to palpation noted. Neurologic:  Normal speech and language. No gross focal neurologic deficits are appreciated.    ED Results / Procedures / Treatments   Labs (all labs ordered are listed, but only abnormal results are displayed) Labs Reviewed - No data to display  RADIOLOGY MRI of lumbar spine reviewed and interpreted by me with no evidence of cord compression.  PROCEDURES:  Critical Care performed: No  Procedures   MEDICATIONS ORDERED IN ED: Medications  HYDROcodone-acetaminophen (NORCO/VICODIN) 5-325 MG per tablet 1 tablet (1 tablet Oral Given 03/10/23 1743)  ketorolac (TORADOL) 30 MG/ML injection 30 mg (30 mg Intramuscular Given 03/10/23 1839)     IMPRESSION / MDM / ASSESSMENT AND PLAN / ED COURSE  I reviewed the triage vital signs and the nursing notes.  82 y.o. female with past medical history of hypertension, lumbar radiculopathy, and GERD who presents to the ED with increasing pain in the middle of her lower back for the past 2 weeks not alleviated by pain medication.  Patient's presentation is most consistent with acute complicated illness / injury requiring diagnostic workup.  Differential diagnosis includes, but is not limited to, cauda equina, lumbar radiculopathy, lumbar myelopathy, lumbar strain, compression fracture.  Patient uncomfortable appearing but in no acute distress, vital signs are unremarkable.  She remains neurovascular intact to her bilateral lower extremities.  We will further assess for cauda equina or other myelopathy with MRI of her lumbar spine given intractable pain.  Plan to treat symptomatically with Norco as well as IM Toradol and reassess.  MRI of  lumbar spine shows no evidence of cord compression, does show compression fracture that appears to be acute with about 25% loss of height.  This is likely the source of her pain and we will place patient in TLSO brace.  She does report that pain is improved following dose of Norco and IM Toradol.  She is appropriate for outpatient management and follow-up with her neurosurgeon, was counseled to return to the ED for new or worsening symptoms.  Patient and family agree with plan.      FINAL CLINICAL IMPRESSION(S) / ED DIAGNOSES   Final diagnoses:  Compression fracture of L1 vertebra, initial encounter (HCC)     Rx / DC Orders   ED Discharge Orders          Ordered    HYDROcodone-acetaminophen (NORCO/VICODIN) 5-325 MG tablet  Every 4 hours PRN        03/10/23 2216             Note:  This document was prepared using Dragon voice recognition software and may include unintentional dictation errors.   Chesley Noon, MD 03/10/23 2217

## 2023-03-10 NOTE — ED Notes (Signed)
Called for Stat TLSO

## 2023-03-10 NOTE — Telephone Encounter (Signed)
Patient is calling that the steroid and tramadol is not helping with the pain. Her pain is constant all day but her pain is worse when she gets out of bed. She is not sleeping in her bed tonight, she might have to sleep in a chair. Are there any suggestions?

## 2023-03-10 NOTE — ED Triage Notes (Addendum)
Pt to triage via wheelchair  pt has back pain for 2 weeks.  Pt reports pain worse today.  Pt taking tramadol, tylenol and motrin without relief.   Pt started tramadol today.   Pt alert   speech clear.  Pt taken to room 7 via wheelchair.  Family with pt

## 2023-03-10 NOTE — Progress Notes (Signed)
Orthopedic Tech Progress Note Patient Details:  Anne [name] Sep 14, 1941 [id number]  Patient ID: Anne [name], female   DOB: 07/04/1941, 82 y.o.   MRN: [id number] Brace ordered. Anne [name] 03/10/2023, 9:37 PM

## 2023-03-11 ENCOUNTER — Other Ambulatory Visit: Payer: Self-pay | Admitting: Neurosurgery

## 2023-03-11 NOTE — Telephone Encounter (Signed)
Duwayne Heck said there is no need to move her appointment to sooner than 6/4, as she needs the brace (which he has), medications, and time. We can add xrays to her appt on 6/4.  Dr Myer Haff said if she wants to switch to an appointment with him, he will see her in 6 weeks.   It may be best to keep her appt with Duwayne Heck rather than wait longer to see Dr Myer Haff

## 2023-03-11 NOTE — Telephone Encounter (Signed)
Patient is calling this morning that she went to the ER last night and she has a compression fracture. She would like to follow-up with Dr.Yarbrough himself. Since she did have an MRI should she see someone sooner than her appt on 6/4 with Danielle?

## 2023-03-11 NOTE — Telephone Encounter (Signed)
I spoke to the patient and she agreed with the plan.

## 2023-03-12 DIAGNOSIS — S32010A Wedge compression fracture of first lumbar vertebra, initial encounter for closed fracture: Secondary | ICD-10-CM

## 2023-03-15 ENCOUNTER — Ambulatory Visit: Payer: BC Managed Care – PPO | Admitting: Neurosurgery

## 2023-03-15 ENCOUNTER — Other Ambulatory Visit: Payer: Self-pay | Admitting: Dermatology

## 2023-03-16 ENCOUNTER — Other Ambulatory Visit: Payer: Self-pay | Admitting: Neurosurgery

## 2023-03-16 DIAGNOSIS — S32000A Wedge compression fracture of unspecified lumbar vertebra, initial encounter for closed fracture: Secondary | ICD-10-CM

## 2023-03-22 ENCOUNTER — Other Ambulatory Visit: Payer: Self-pay | Admitting: Interventional Radiology

## 2023-03-22 ENCOUNTER — Ambulatory Visit
Admission: RE | Admit: 2023-03-22 | Discharge: 2023-03-22 | Disposition: A | Discharge status: Home / Self Care | Payer: Medicare HMO | Source: Ambulatory Visit | Attending: Neurosurgery | Admitting: Neurosurgery

## 2023-03-22 ENCOUNTER — Ambulatory Visit: Payer: BC Managed Care – PPO | Admitting: Neurosurgery

## 2023-03-22 DIAGNOSIS — S32000A Wedge compression fracture of unspecified lumbar vertebra, initial encounter for closed fracture: Secondary | ICD-10-CM

## 2023-03-22 HISTORY — PX: IR RADIOLOGIST EVAL & MGMT: IMG5224

## 2023-03-22 LAB — CBC
Hemoglobin: 14.1 g/dL (ref 11.7–15.5)
MCH: 33 pg (ref 27.0–33.0)
MCHC: 33.9 g/dL (ref 32.0–36.0)
MCV: 97.4 fL (ref 80.0–100.0)
MPV: 10.5 fL (ref 7.5–12.5)
RDW: 12 % (ref 11.0–15.0)
WBC: 4.9 10*3/uL (ref 3.8–10.8)

## 2023-03-22 NOTE — H&P (Signed)
Interventional Radiology - Clinic Visit, Initial H&P    Referring Provider: Susanne Borders, PA  Reason for Visit: Back pain     History of Present Illness  Anne [name] is a 82 y.o. female with a relevant past medical history of osteoporosis (DEXA Feb 2022) seen today in Interventional Radiology clinic for symptomatic L1 compression fracture.  The patient reports relatively acute onset of lower back pain starting February 28, 2023.  Patient reports that her main physical activity includes yoga, although she can not report a direct traumatic incident leading up to her back pain.  Several days following the onset of back pain, she was evaluated at an emergent orthopedic clinic where she was diagnosed with an age indeterminate compression fracture of the L1 vertebral body based on radiographs, and was prescribed a combination of muscle relaxers and a steroid taper.  Her pain did not improve, and she was subsequently evaluated in the emergency department on Mar 10, 2023.  MRI lumbar spine at that time demonstrated acute to subacute compression fracture of the L1 vertebral body with 25% height loss.  She was then discharged with both a TLSO brace and prescription pain medication (Vicodin).     Her back pain remains severe up to 10/10, despite compliance with her brace.  She has taken Vicodin as needed for pain, with minimal improvement and complains of constipation as a side effect.  She has also taken Tylenol and ibuprofen on a rotating scheduled 3 times a day.  She has significant disability with 19/24 positive on the [employer] disability questionnaire.  In particular, she notes that her back pain has affected her appetite, ability to sleep, and perform routine activities around the house.    Past medical history significant for diagnosis of osteoporosis, most recently on DEXA scan February 2022 per outside chart review.    Additional Past Medical History Past Medical History:   Diagnosis Date   Arthritis    osteoarthritis   GERD (gastroesophageal reflux disease)    Hypertension    Lumbar radiculopathy 04/2021     Surgical History  Past Surgical History:  Procedure Laterality Date   ANKLE FRACTURE SURGERY Left    metal in ankle   APPENDECTOMY     CESAREAN SECTION     CHOLECYSTECTOMY     epidural steroid inj     EYE SURGERY Bilateral    cataract extractions   JOINT REPLACEMENT Bilateral    partial replacement on left, full replacement on right   LUMBAR LAMINECTOMY/DECOMPRESSION MICRODISCECTOMY Left 04/22/2021   Procedure: LEFT L3-4 MICRODISCECTOMY, L4-S1 LEFT DECOMPRESSION;  Surgeon: Venetia Night, MD;  Location: [facility] ORS;  Service: Neurosurgery;  Laterality: Left;   REVERSE SHOULDER ARTHROPLASTY Right 09/07/2021   Procedure: Right reverse shoulder arthroplasty, biceps tenodesis;  Surgeon: Signa Kell, MD;  Location: [facility] ORS;  Service: Orthopedics;  Laterality: Right;   TONSILLECTOMY     TRIGGER FINGER RELEASE Left    WRIST FRACTURE SURGERY Right    metal in forearm     Medications  I have reviewed the current medication list. Refer to chart for details. Current Outpatient Medications  Medication Instructions   acetaminophen (TYLENOL) 650 mg, Oral, Every 4 hours PRN   amLODipine (NORVASC) 5 mg, Oral, Daily   B Complex-C (B-COMPLEX WITH VITAMIN C) tablet 1 tablet, Oral, Daily   carteolol (OCUPRESS) 1 % ophthalmic solution 1 drop, Both Eyes, 2 times daily   cholecalciferol (VITAMIN D3) 1,000 Units, Oral, Daily   citalopram (  CELEXA) 10 mg, Oral, Daily at bedtime   doxycycline (PERIOSTAT) 20 mg, Oral, 2 times daily, Take with food.   doxylamine (Sleep) (UNISOM) 25 mg, Oral, At bedtime PRN   Fish Oil 1,000 mg, Oral   HYDROcodone-acetaminophen (NORCO/VICODIN) 5-325 MG tablet 1 tablet, Oral, Every 4 hours PRN   ibuprofen (ADVIL) 600 mg, Oral, Every 8 hours PRN   Multiple Vitamins-Minerals (CENTRUM SILVER 50+WOMEN PO) 1 tablet, Oral, Daily    omeprazole (PRILOSEC) 20 mg, Oral, Daily, With dinner   tiZANidine (ZANAFLEX) 2 mg, 2 times daily      Allergies Allergies  Allergen Reactions   Melatonin Diarrhea   Other Other (See Comments)    Neoprene - causes irritation, itching, swelling   Does patient have contrast allergy: No     Physical Exam Current Vitals Temp: 98.2 F (36.8 C) (Temp Source: Oral)  Pulse Rate: 71  Resp: 16  BP: (!) 153/72  SpO2: 95 %        There is no height or weight on file to calculate BMI.  General: Alert and answers questions appropriately. Frequently stands because of her back pain with sitting.  HEENT: Normocephalic, atraumatic.  Cardiac: Regular rate and rhythm. No dependent edema. Pulmonary: Normal work of breathing. On room air. Back: Focal tenderness in mid to lower back.    Pertinent Lab Results    Latest Ref Rng & Units 09/08/2021    4:11 AM 08/27/2021    2:47 PM 04/14/2021   11:08 AM  CBC  WBC 4.0 - 10.5 K/uL 9.3  6.6  4.4   Hemoglobin 12.0 - 15.0 g/dL 9.5  16.1  09.6   Hematocrit 36.0 - 46.0 % 28.0  41.3  39.4   Platelets 150 - 400 K/uL 205  304  261       Latest Ref Rng & Units 09/08/2021    4:11 AM 08/27/2021    2:47 PM 04/14/2021   11:08 AM  CMP  Glucose 70 - 99 mg/dL 045  95  97   BUN 8 - 23 mg/dL 17  18  14    Creatinine 0.44 - 1.00 mg/dL 4.09  8.11  9.14   Sodium 135 - 145 mmol/L 131  138  138   Potassium 3.5 - 5.1 mmol/L 4.1  4.9  3.8   Chloride 98 - 111 mmol/L 99  102  102   CO2 22 - 32 mmol/L 24  27  24    Calcium 8.9 - 10.3 mg/dL 7.8  78.2  8.9   Total Protein 6.5 - 8.1 g/dL  7.3    Total Bilirubin 0.3 - 1.2 mg/dL  1.0    Alkaline Phos 38 - 126 U/L  49    AST 15 - 41 U/L  27    ALT 0 - 44 U/L  35        Relevant and/or Recent Imaging: MRI L spine 03/10/2023  IMPRESSION: 1. L1 wedge compression fracture with less than 25% height loss and mild bone marrow edema, new compared to 02/03/2021 and likely acute or subacute. 2. No spinal canal or neural  foraminal stenosis. 3. 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 Electronically Signed   By: Deatra Robinson M.D.   On: 03/10/2023 20:37     Assessment & Plan:   Patient has suffered acute osteoporotic fracture of the L1 vertebra.   History and exam have demonstrated the following:  Acute/Subacute fracture by imaging  dated 03/10/2023, Pain on exam concordant with level of fracture, Failure of conservative therapy and pain refractory to narcotic pain mediation, Inability to tolerate narcotic pain medication due to side effects, and Significant disability on the [employer] Disability Questionnaire with 19/24 positive symptoms, reflecting significant impact/impairment of (ADLs)   ICD-10-CM Codes that Support Medical Necessity ([email or web address].at.[email or web address]?[email or web address])  M80.08XA    Age-related osteoporosis with current pathological fracture, vertebra(e), initial encounter for fracture  S32.010A    Wedge compression fracture of first lumbar vertebra, initial encounter for closed fracture    Plan:  L1 vertebral body augmentation with balloon kyphoplasty  Post-procedure disposition: outpatient [facility]   Medication holds: none   The patient has suffered a fracture of the L1 vertebral body. It is recommended that patients aged 42 years or older be evaluated for possible testing or treatment of osteoporosis. A copy of this consult report is sent to the patient's referring physician.  Advanced Care Plan: The patient did not want to provide an Advanced Care Plan at the time of this visit     Total time spent on today's visit was over 40 Minutes, including both face-to-face time and non face-to-face time, personally spent on review of chart (including labs and relevant imaging), discussing further workup and treatment options, referral to specialist if needed, reviewing outside records if  pertinent, answering patient questions, and coordinating care regarding L1 vertebral body fracture as well as management strategy.      Olive Bass, MD  Vascular and Interventional Radiology 03/22/2023 9:38 AM

## 2023-03-23 ENCOUNTER — Other Ambulatory Visit: Payer: Self-pay | Admitting: Neurosurgery

## 2023-03-23 DIAGNOSIS — M8008XA Age-related osteoporosis with current pathological fracture, vertebra(e), initial encounter for fracture: Secondary | ICD-10-CM

## 2023-03-23 DIAGNOSIS — S32010A Wedge compression fracture of first lumbar vertebra, initial encounter for closed fracture: Secondary | ICD-10-CM

## 2023-03-23 LAB — CBC
HCT: 41.6 % (ref 35.0–45.0)
Platelets: 293 10*3/uL (ref 140–400)
RBC: 4.27 10*6/uL (ref 3.80–5.10)

## 2023-03-23 LAB — PROTIME-INR
INR: 1.1
Prothrombin Time: 11.5 s (ref 9.0–11.5)

## 2023-03-23 LAB — COMPLETE METABOLIC PANEL WITH GFR
AG Ratio: 1.8 (calc) (ref 1.0–2.5)
ALT: 23 U/L (ref 6–29)
AST: 17 U/L (ref 10–35)
Albumin: 4.1 g/dL (ref 3.6–5.1)
Alkaline phosphatase (APISO): 51 U/L (ref 37–153)
BUN: 16 mg/dL (ref 7–25)
CO2: 23 mmol/L (ref 20–32)
Calcium: 9.2 mg/dL (ref 8.6–10.4)
Chloride: 100 mmol/L (ref 98–110)
Creat: 0.62 mg/dL (ref 0.60–1.00)
Globulin: 2.3 g/dL (calc) (ref 1.9–3.7)
Glucose, Bld: 99 mg/dL (ref 65–139)
Potassium: 4.6 mmol/L (ref 3.5–5.3)
Sodium: 134 mmol/L — ABNORMAL LOW (ref 135–146)
Total Bilirubin: 0.5 mg/dL (ref 0.2–1.2)
Total Protein: 6.4 g/dL (ref 6.1–8.1)
eGFR: 91 mL/min/{1.73_m2} (ref >=60)

## 2023-03-23 LAB — SPECIMEN COMPROMISED

## 2023-04-05 NOTE — Discharge Instructions (Signed)
Kyphoplasty Post Procedure Discharge Instructions  May resume a regular diet and any medications that you routinely take (including pain medications). However, if you are taking Aspirin or an anticoagulant/blood thinner you will be told when you can resume taking these by the healthcare provider. No driving day of procedure. The day of your procedure take it easy. You may use an ice pack as needed to injection sites on back.  Ice to back 30 minutes on and 30 minutes off, as needed. May remove bandaids tomorrow after taking a shower. Replace daily with a clean bandaid until healed.  Do not lift anything heavier than a milk jug for 1-2 weeks or determined by your physician.  Follow up with your physician in 2 weeks.    Please contact our office at [phone] for the following symptoms or if you have any questions:  Fever greater than 100 degrees Increased swelling, pain, or redness at injection site. Increased back and/or leg pain New numbness or change in symptoms from before the procedure.    Thank you for visiting [facility]. 

## 2023-04-06 ENCOUNTER — Ambulatory Visit
Admission: RE | Admit: 2023-04-06 | Discharge: 2023-04-06 | Disposition: A | Discharge status: Home / Self Care | Payer: Medicare HMO | Source: Ambulatory Visit | Attending: Neurosurgery | Admitting: Neurosurgery

## 2023-04-06 DIAGNOSIS — S32010A Wedge compression fracture of first lumbar vertebra, initial encounter for closed fracture: Secondary | ICD-10-CM

## 2023-04-06 DIAGNOSIS — M8008XA Age-related osteoporosis with current pathological fracture, vertebra(e), initial encounter for fracture: Secondary | ICD-10-CM

## 2023-04-06 HISTORY — PX: IR KYPHO LUMBAR INC FX REDUCE BONE BX UNI/BIL CANNULATION INC/IMAGING: IMG5519

## 2023-04-06 MED ORDER — SODIUM CHLORIDE 0.9 % IV SOLN: INTRAVENOUS | Status: DC

## 2023-04-06 MED ORDER — MIDAZOLAM HCL 2 MG/2ML IJ SOLN
1.0000 mg | INTRAMUSCULAR | Status: DC | PRN
  Administered 2023-04-06 (×5): 0.5 mg via INTRAVENOUS

## 2023-04-06 MED ORDER — KETOROLAC TROMETHAMINE 30 MG/ML IJ SOLN: 30.0000 mg | Freq: Once | INTRAMUSCULAR | Status: DC

## 2023-04-06 MED ORDER — CEFAZOLIN SODIUM-DEXTROSE 2-4 GM/100ML-% IV SOLN
2.0000 g | INTRAVENOUS | Status: AC
  Administered 2023-04-06: 2 g via INTRAVENOUS

## 2023-04-06 MED ORDER — FENTANYL CITRATE PF 50 MCG/ML IJ SOSY
25.0000 ug | PREFILLED_SYRINGE | INTRAMUSCULAR | Status: DC | PRN
  Administered 2023-04-06 (×4): 25 ug via INTRAVENOUS

## 2023-04-06 NOTE — Progress Notes (Signed)
Pt back in nursing recovery area. Pt still drowsy from procedure but will wake up when spoken to. Pt follows commands, talks in complete sentences and has no complaints at this time. Pt will remain in nursing station until discharge.  ?

## 2023-04-11 ENCOUNTER — Other Ambulatory Visit: Payer: Self-pay | Admitting: Interventional Radiology

## 2023-04-11 ENCOUNTER — Telehealth: Payer: Self-pay

## 2023-04-11 DIAGNOSIS — Z09 Encounter for follow-up examination after completed treatment for conditions other than malignant neoplasm: Secondary | ICD-10-CM

## 2023-04-11 NOTE — Progress Notes (Signed)
Phone call to pt to follow up from her kyphoplasty on 04/06/23. Pt reports her pain is signigicant and lower than the injection site. Pt reports she uncomfortable in any position for more than an hour. Pt denies any signs of infection, redness at the site, draining or fever. Pt has no further complaints at this time and will be scheduled for an in person follow up with Dr. Juliette Alcide. Pt advised to call back if anything were to change or any concerns arise and we will arrange an in person appointment. Pt verbalized understanding.

## 2023-04-12 ENCOUNTER — Ambulatory Visit: Payer: BC Managed Care – PPO | Admitting: Neurosurgery

## 2023-04-13 ENCOUNTER — Ambulatory Visit
Admission: RE | Admit: 2023-04-13 | Discharge: 2023-04-13 | Disposition: A | Discharge status: Home / Self Care | Payer: BC Managed Care – PPO | Source: Ambulatory Visit | Attending: Interventional Radiology | Admitting: Interventional Radiology

## 2023-04-13 ENCOUNTER — Other Ambulatory Visit: Payer: Self-pay | Admitting: Interventional Radiology

## 2023-04-13 DIAGNOSIS — M545 Low back pain, unspecified: Secondary | ICD-10-CM

## 2023-04-13 DIAGNOSIS — Z09 Encounter for follow-up examination after completed treatment for conditions other than malignant neoplasm: Secondary | ICD-10-CM

## 2023-04-13 NOTE — Progress Notes (Signed)
IR brief note   The patient returns today for continued lower back pain after L1 kyphoplasty on Apr 06, 2023.  Patient reports that there is no significant pain at the level of the procedure.  On exam, she points to lower back pain overlying the lower lumbar spine and lumbosacral region.  Lumbar spine radiographs obtained today demonstrated no complicating feature of L1 kyphoplasty or new compression fracture of the lumbar spine.  Review of her most recent MRI lumbar spine on Mar 10, 2023 had described moderate and advanced facet arthropathy at L4-L5 and L5-S1, respectively.  There was no other significant spinal canal or neural foraminal stenosis.  Given these findings, her back pain can likely be attributed to her facet arthropathy.  I will coordinate with her referring neurosurgical provider to get her scheduled for possible bilateral L4-L5 and L5-S1 facet joint steroid injections.    Olive Bass, MD  Vascular and Interventional Radiology 04/13/2023 4:51 PM

## 2023-04-14 ENCOUNTER — Other Ambulatory Visit: Payer: Self-pay | Admitting: Neurosurgery

## 2023-04-14 DIAGNOSIS — M5416 Radiculopathy, lumbar region: Secondary | ICD-10-CM

## 2023-04-14 MED ORDER — CELECOXIB 100 MG PO CAPS: 100.0000 mg | ORAL_CAPSULE | Freq: Two times a day (BID) | ORAL | 2 refills | Status: DC

## 2023-04-14 NOTE — Progress Notes (Signed)
Patient contacted me via MyChart asking about a prescription for Celebrex.  This is worked for her back pain in the past.  I have sent Celebrex into her pharmacy

## 2023-04-18 ENCOUNTER — Other Ambulatory Visit: Payer: Self-pay | Admitting: Neurosurgery

## 2023-04-18 DIAGNOSIS — M47819 Spondylosis without myelopathy or radiculopathy, site unspecified: Secondary | ICD-10-CM

## 2023-04-20 ENCOUNTER — Telehealth: Payer: BC Managed Care – PPO

## 2023-04-20 NOTE — Telephone Encounter (Signed)
-----   Message from Rockey Situ sent at 04/20/2023 11:54 AM EDT ----- Lynden Ang from [facility] calling that she is not able to get the injection authorized by [facility]. She has sent them all the information she has access to. She wants our office to contact [employer] for the approval. Case# [id number]

## 2023-04-20 NOTE — Telephone Encounter (Signed)
Is this something you fell comfortable doing? They are wanting [place] to do the authorization?

## 2023-04-21 NOTE — Telephone Encounter (Signed)
Discussed with Duwayne Heck and she is asking why can't Dr. Juliette Alcide do this? Can you please call Lynden Ang and mention this to her? We dont have his NPI, CPT codes, Diagnosis codes, phone number, etc. Duwayne Heck is not sure what she would need to do?

## 2023-04-21 NOTE — Telephone Encounter (Signed)
Cathy please see Danielle's question.

## 2023-04-22 NOTE — Telephone Encounter (Signed)
Hey cathy can you just send in our office notes as well? Normally when I do authorizations for imaging I send all notes from all providers regarding the issue and I will go back for at least 1 year. Do I need to print them off and fax over to you so you can include them in all the information that you are sending? If not, I can fax our office note, but I need the case number, fax number and who I need to send it to, etc.

## 2023-04-22 NOTE — Telephone Encounter (Signed)
Seems like we just need to send her insurance company our office notes?

## 2023-04-26 NOTE — Telephone Encounter (Signed)
Patient called the office asking to speak to Duwayne Heck or Dr.Yarbrough because her insurance is denying her claim for kyphoplasty. I told her that the our office is aware and that we are working on setting up a peer to peer. She said to please call her with an update. She aware to give [place] 24-48 hours for a response.

## 2023-04-27 NOTE — Telephone Encounter (Signed)
When I called [employer] to try and set up a peer to peer for Anne [name], the representative is asking for the NPI of the provider and also the CPT codes that are connected to the case.   Lynden Ang, In order to set up a peer to peer I will need this information. Can you please help with this?

## 2023-04-28 NOTE — Telephone Encounter (Signed)
I have faxed everything in her chart that is regarding her back to the appeals department.

## 2023-04-28 NOTE — Telephone Encounter (Signed)
Ok, I have called the number [phone] for peer to peer scheduling multiple different times. The first time they told me there was no denial on file or option for peer to peer, I was then transferred to the pharmacy which I know is not correct, I was then told to call [employer] at [phone] and when I gave them the reference number and information they then told me to call the [phone] again. I called yet again and was told the reference number I gave was incorrect and should start with the number 2. In order for [place] to get this taken care of I need the correct information.  Lynden Ang if you have a copy of the denial from the insurance company then I need you to fax this to [phone] so I can see exactly where I need to call and I can have all the information in front of me. Thanks

## 2023-04-28 NOTE — Telephone Encounter (Signed)
I called the number on the front of the denial letter [phone]) and was told that a peer to peer is not available, we can only do a appeal.  This is considered a standard appeal, because it is not considered urgent.   Clinicals need to be faxed to [phone], Attn: Appeals Department. Ref# also needs to be listed which is [id number]

## 2023-04-29 ENCOUNTER — Telehealth: Payer: Self-pay | Admitting: Neurosurgery

## 2023-04-29 NOTE — Telephone Encounter (Signed)
Patient is requesting to speak to [facility]. She is still hurting and would like to know exactly what is wrong with her back. What is the cause of her pain? She wants to know what her restrictions are.  She does not want to continue to hurt herself. She is not taking Celebrex because it caused her GI issues. She is taking tylenol for the pain but it is not doing anything.

## 2023-05-03 NOTE — Telephone Encounter (Signed)
I have received a fax from [employer] stating that they have received my appeal request and per the note at the bottom it says they will respond to me by May 28, 2023.  Appeal Case # [id number]

## 2023-05-06 ENCOUNTER — Other Ambulatory Visit: Payer: Self-pay

## 2023-05-06 DIAGNOSIS — S32010A Wedge compression fracture of first lumbar vertebra, initial encounter for closed fracture: Secondary | ICD-10-CM

## 2023-05-06 NOTE — Telephone Encounter (Signed)
Can't submit a level 2 appeal unless the level 1 appeal is denied. To qualify for expedited:  "a delay in decision making might seriously jeopardize the life or health of the member or otherwise jeopardize the member's' ability to regain maximum function."

## 2023-05-06 NOTE — Telephone Encounter (Signed)
Pt says that Anne [name] called her and said that a standard appeal was submitted. She is requesting that we resend as a level 2 or expedited appeal as she is in a lot of pain and cannot wait the 30 days.

## 2023-05-10 ENCOUNTER — Encounter: Payer: Self-pay | Admitting: Neurosurgery

## 2023-05-10 ENCOUNTER — Ambulatory Visit
Admission: RE | Admit: 2023-05-10 | Discharge: 2023-05-10 | Disposition: A | Discharge status: Home / Self Care | Payer: Medicare HMO | Source: Ambulatory Visit | Attending: Neurosurgery | Admitting: Neurosurgery

## 2023-05-10 ENCOUNTER — Ambulatory Visit: Payer: Medicare HMO | Admitting: Neurosurgery

## 2023-05-10 ENCOUNTER — Ambulatory Visit
Admission: RE | Admit: 2023-05-10 | Discharge: 2023-05-10 | Disposition: A | Discharge status: Home / Self Care | Payer: Medicare HMO | Attending: Neurosurgery | Admitting: Neurosurgery

## 2023-05-10 VITALS — BP 120/78 | Wt 147.0 lb

## 2023-05-10 DIAGNOSIS — S32010A Wedge compression fracture of first lumbar vertebra, initial encounter for closed fracture: Secondary | ICD-10-CM | POA: Insufficient documentation

## 2023-05-10 DIAGNOSIS — M47816 Spondylosis without myelopathy or radiculopathy, lumbar region: Secondary | ICD-10-CM

## 2023-05-10 DIAGNOSIS — M546 Pain in thoracic spine: Secondary | ICD-10-CM

## 2023-05-10 DIAGNOSIS — M47819 Spondylosis without myelopathy or radiculopathy, site unspecified: Secondary | ICD-10-CM

## 2023-05-10 DIAGNOSIS — M545 Low back pain, unspecified: Secondary | ICD-10-CM

## 2023-05-10 MED ORDER — BACLOFEN 10 MG PO TABS: 10.0000 mg | ORAL_TABLET | Freq: Three times a day (TID) | ORAL | 0 refills | Status: DC | PRN

## 2023-05-10 MED ORDER — HYDROCODONE-ACETAMINOPHEN 5-325 MG PO TABS: 1.0000 | ORAL_TABLET | Freq: Four times a day (QID) | ORAL | 0 refills | Status: AC | PRN

## 2023-05-10 NOTE — Telephone Encounter (Signed)
No update on [employer] portal as of 05/10/23

## 2023-05-10 NOTE — Progress Notes (Signed)
Referring Physician:  No referring provider defined for this encounter.  Primary Physician:  Belinda Block, MD  History of Present Illness: 05/10/23  [name] is a 82 y.o with a history of rosacea, OA, GERD, HTN who is here today with a chief complaint of aching in her low back.  She has since undergone an L1 kyphoplasty. She states the kyphoplasty helped with the pain she was having around L1 but that she has had new lower back pain since that procedure which is debilitating.  She has also had about a week of thoracic back pain but denies any inciting event for this.  She has continued to take Celebrex which provides some minimal relief.  She has stopped taking tizanidine as this was causing lightheadedness.  03/08/23 [name] is a 82 y.o with a history of rosacea, OA, GERD, HTN who is here today with a chief complaint of aching in her low back.  This started about 2 weeks ago after attempting a new type of chair yoga.  She describes pain that starts in her low back and radiates bilaterally around to her flanks.  She denies any radiating pain, numbness, or tingling down her legs.  She states her back pain is worse with bending over, sitting, or twisting and improves with laying flat or standing and walking.  She was seen at [facility] and had an x-ray and was told that she had a old appearing compression fracture which was an interval change from her surgery in 2022.  She is currently taking Tylenol, ibuprofen, and tizanidine.  She was given a prescription for Medrol Dosepak but has not started this yet.  Conservative measures:  Physical therapy: has not participated  Multimodal medical therapy including regular antiinflammatories: ibuprofen, tylenol  Injections: has not received epidural steroid injections for this episode  Past Surgery: 04/22/21 left L3-4 microdiscectomy, left L4-S1 decompression  Anne [name] has no  symptoms of cervical myelopathy.  The symptoms are causing a significant impact on the patient's life.   Review of Systems:  A 10 point review of systems is negative, except for the pertinent positives and negatives detailed in the HPI.  Past Medical History: Past Medical History:  Diagnosis Date   Arthritis    osteoarthritis   GERD (gastroesophageal reflux disease)    Hypertension    Lumbar radiculopathy 04/2021    Past Surgical History: Past Surgical History:  Procedure Laterality Date   ANKLE FRACTURE SURGERY Left    metal in ankle   APPENDECTOMY     CESAREAN SECTION     CHOLECYSTECTOMY     epidural steroid inj     EYE SURGERY Bilateral    cataract extractions   IR KYPHO LUMBAR INC FX REDUCE BONE BX UNI/BIL CANNULATION INC/IMAGING  04/06/2023   IR RADIOLOGIST EVAL & MGMT  03/22/2023   JOINT REPLACEMENT Bilateral    partial replacement on left, full replacement on right   LUMBAR LAMINECTOMY/DECOMPRESSION MICRODISCECTOMY Left 04/22/2021   Procedure: LEFT L3-4 MICRODISCECTOMY, L4-S1 LEFT DECOMPRESSION;  Surgeon: Venetia Night, MD;  Location: [facility] ORS;  Service: Neurosurgery;  Laterality: Left;   REVERSE SHOULDER ARTHROPLASTY Right 09/07/2021   Procedure: Right reverse shoulder arthroplasty, biceps tenodesis;  Surgeon: Signa Kell, MD;  Location: [facility] ORS;  Service: Orthopedics;  Laterality: Right;   TONSILLECTOMY     TRIGGER FINGER RELEASE Left    WRIST FRACTURE SURGERY Right    metal in forearm  Allergies: Allergies as of 05/10/2023 - Review Complete 04/06/2023  Allergen Reaction Noted   Other Other (See Comments) 08/24/2021   Melatonin Diarrhea 05/04/2021    Medications: Outpatient Encounter Medications as of 05/10/2023  Medication Sig   acetaminophen (TYLENOL) 325 MG tablet Take 2 tablets (650 mg total) by mouth every 4 (four) hours as needed for mild pain ((score 1 to 3) or temp > 100.5).   amLODipine (NORVASC) 5 MG tablet Take 5 mg by mouth daily.   B  Complex-C (B-COMPLEX WITH VITAMIN C) tablet Take 1 tablet by mouth daily.   carteolol (OCUPRESS) 1 % ophthalmic solution Place 1 drop into both eyes 2 (two) times daily.   celecoxib (CELEBREX) 100 MG capsule Take 1 capsule (100 mg total) by mouth 2 (two) times daily.   cholecalciferol (VITAMIN D3) 25 MCG (1000 UNIT) tablet Take 1,000 Units by mouth daily.   citalopram (CELEXA) 10 MG tablet Take 10 mg by mouth at bedtime.   doxycycline (PERIOSTAT) 20 MG tablet TAKE 1 TABLET (20 MG TOTAL) BY MOUTH 2 (TWO) TIMES DAILY. TAKE WITH FOOD.   doxylamine, Sleep, (UNISOM) 25 MG tablet Take 25 mg by mouth at bedtime as needed.   HYDROcodone-acetaminophen (NORCO/VICODIN) 5-325 MG tablet Take 1 tablet by mouth every 4 (four) hours as needed for moderate pain.   Multiple Vitamins-Minerals (CENTRUM SILVER 50+WOMEN PO) Take 1 tablet by mouth daily.   Omega-3 Fatty Acids (FISH OIL) 1000 MG CAPS Take 1,000 mg by mouth.   omeprazole (PRILOSEC) 20 MG capsule Take 20 mg by mouth daily. With dinner   No facility-administered encounter medications on file as of 05/10/2023.    Social History: Social History   Tobacco Use   Smoking status: Never   Smokeless tobacco: Never  Vaping Use   Vaping Use: Never used  Substance Use Topics   Alcohol use: Yes    Alcohol/week: 1.0 standard drink of alcohol    Types: 1 Glasses of wine per week    Comment: usually has a glass of wine with dinner   Drug use: Never    Family Medical History: No family history on file.  Physical Examination: There were no vitals filed for this visit.  There is no height or weight on file to calculate BMI.   General: Patient is well developed, well nourished, calm, collected, and in no apparent distress. Attention to examination is appropriate.  Psychiatric: Patient is non-anxious.  Head:  Pupils equal, round, and reactive to light.  ENT:  Oral mucosa appears well hydrated.  Neck:   Supple.  Full range of  motion.  Respiratory: Patient is breathing without any difficulty.  Extremities: No edema.  Vascular: Palpable dorsal pedal pulses.  Skin:   On exposed skin, there are no abnormal skin lesions.  NEUROLOGICAL:     Awake, alert, oriented to person, place, and time.  Speech is clear and fluent. Fund of knowledge is appropriate.   Cranial Nerves: Pupils equal round and reactive to light.  Facial tone is symmetric.  Facial sensation is symmetric.  ROM of spine: limited flex/ex due to pain.  Palpation of spine: non tender.    Strength: Side Biceps Triceps Deltoid Interossei Grip Wrist Ext. Wrist Flex.  R 5 5 5 5 5 5 5   L 5 5 5 5 5 5 5    Side Iliopsoas Quads Hamstring PF DF EHL  R 5 5 5 5 5 5   L 5 5 5 5 5 5    Reflexes are 2+ and  symmetric at the biceps, triceps, brachioradialis, patella and achilles.   Hoffman's is absent.  Clonus is not present.  Toes are down-going.  Bilateral upper and lower extremity sensation is intact to light touch.    Gait is normal.    Medical Decision Making  Imaging: 03/10/23 MRI L spine IMPRESSION: 1. L1 wedge compression fracture with less than 25% height loss and mild bone marrow edema, new compared to 02/03/2021 and likely acute or subacute. 2. No spinal canal or neural foraminal stenosis. 3. 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     Electronically Signed   By: Deatra Robinson M.D.   On: 03/10/2023 20:37  Assessment and Plan: [name] is a pleasant 82 y.o. female with history of lumbosacral disease presenting with acute low back pain found to have L1 compression fracture.  Pain unfortunately has been persistent despite a L1 kyphoplasty.  MRI revealed significant facet arthropathy which is likely the cause of her ongoing pain.  We are in the process of getting a MBB approved.  In the meantime I have suggested some medication changes.  She is to stop the tizanidine  given her side effects from this.  I encouraged her to continue Celebrex and have given her prescription for Norco and baclofen and instructed her on medication side effects.  Given her new onset of thoracic back pain, would like to get a dedicated thoracic x-ray and have placed an order for this.  My office will contact her with the results of this.  In the meantime we will give her the information to move forward with potential patient appeal.   I spent a total of 40 minutes in both face-to-face and non-face-to-face activities for this visit on the date of this encounter including review of records, review of symptoms, discussion of symptoms, physical exam, discussion of differential diagnoses, and plan of care.[facility]

## 2023-05-11 ENCOUNTER — Ambulatory Visit
Admission: RE | Admit: 2023-05-11 | Discharge: 2023-05-11 | Disposition: A | Discharge status: Home / Self Care | Payer: Medicare HMO | Attending: Neurosurgery | Admitting: Neurosurgery

## 2023-05-11 ENCOUNTER — Ambulatory Visit
Admission: RE | Admit: 2023-05-11 | Discharge: 2023-05-11 | Disposition: A | Discharge status: Home / Self Care | Payer: Medicare HMO | Source: Ambulatory Visit | Attending: Neurosurgery | Admitting: Neurosurgery

## 2023-05-11 DIAGNOSIS — M47819 Spondylosis without myelopathy or radiculopathy, site unspecified: Secondary | ICD-10-CM | POA: Insufficient documentation

## 2023-05-11 DIAGNOSIS — M546 Pain in thoracic spine: Secondary | ICD-10-CM | POA: Diagnosis present

## 2023-05-11 DIAGNOSIS — M545 Low back pain, unspecified: Secondary | ICD-10-CM | POA: Insufficient documentation

## 2023-05-11 DIAGNOSIS — S32010A Wedge compression fracture of first lumbar vertebra, initial encounter for closed fracture: Secondary | ICD-10-CM

## 2023-05-11 NOTE — Telephone Encounter (Signed)
Received fax from [place] that appeal has been approved. Appeal case # [id number]. Auth# [id number] from 05/10/23-11/10/23. Notified pt via mychart.

## 2023-05-17 ENCOUNTER — Ambulatory Visit
Admission: RE | Admit: 2023-05-17 | Discharge: 2023-05-17 | Disposition: A | Discharge status: Home / Self Care | Payer: BC Managed Care – PPO | Source: Ambulatory Visit | Attending: Neurosurgery | Admitting: Neurosurgery

## 2023-05-17 ENCOUNTER — Other Ambulatory Visit: Payer: Self-pay | Admitting: Neurosurgery

## 2023-05-17 DIAGNOSIS — M5416 Radiculopathy, lumbar region: Secondary | ICD-10-CM

## 2023-05-17 MED ORDER — METHYLPREDNISOLONE ACETATE 40 MG/ML INJ SUSP (RADIOLOG
80.0000 mg | Freq: Once | INTRAMUSCULAR | Status: AC
  Administered 2023-05-17: 80 mg via INTRA_ARTICULAR

## 2023-05-17 MED ORDER — IOPAMIDOL (ISOVUE-M 200) INJECTION 41%
1.0000 mL | Freq: Once | INTRAMUSCULAR | Status: AC
  Administered 2023-05-17: 1 mL via INTRA_ARTICULAR

## 2023-05-17 NOTE — Discharge Instructions (Signed)

## 2023-05-27 ENCOUNTER — Other Ambulatory Visit: Payer: Self-pay | Admitting: Orthopedic Surgery

## 2023-05-27 MED ORDER — BACLOFEN 10 MG PO TABS: 10.0000 mg | ORAL_TABLET | Freq: Three times a day (TID) | ORAL | 0 refills | Status: AC | PRN

## 2023-05-27 NOTE — Telephone Encounter (Signed)
Refill of baclofen sent to pharmacy. Please let her know.

## 2023-05-27 NOTE — Telephone Encounter (Signed)
Patient aware of rx

## 2023-06-02 ENCOUNTER — Ambulatory Visit
Admission: RE | Admit: 2023-06-02 | Discharge: 2023-06-02 | Disposition: A | Discharge status: Home / Self Care | Payer: Medicare HMO | Source: Ambulatory Visit | Attending: Unknown Physician Specialty | Admitting: Unknown Physician Specialty

## 2023-06-02 DIAGNOSIS — E041 Nontoxic single thyroid nodule: Secondary | ICD-10-CM | POA: Insufficient documentation

## 2023-06-02 NOTE — Telephone Encounter (Signed)
Pt called in regards to this message, asking for you to give her a call

## 2023-06-06 ENCOUNTER — Other Ambulatory Visit: Payer: Self-pay | Admitting: Unknown Physician Specialty

## 2023-06-06 DIAGNOSIS — E041 Nontoxic single thyroid nodule: Secondary | ICD-10-CM

## 2023-06-07 ENCOUNTER — Other Ambulatory Visit: Payer: Self-pay | Admitting: Neurosurgery

## 2023-06-07 DIAGNOSIS — M545 Low back pain, unspecified: Secondary | ICD-10-CM

## 2023-06-07 DIAGNOSIS — M47819 Spondylosis without myelopathy or radiculopathy, site unspecified: Secondary | ICD-10-CM

## 2023-06-08 ENCOUNTER — Other Ambulatory Visit: Payer: Self-pay | Admitting: Neurosurgery

## 2023-06-08 ENCOUNTER — Encounter: Payer: Self-pay | Admitting: Neurosurgery

## 2023-06-08 DIAGNOSIS — M545 Low back pain, unspecified: Secondary | ICD-10-CM

## 2023-06-08 DIAGNOSIS — M47819 Spondylosis without myelopathy or radiculopathy, site unspecified: Secondary | ICD-10-CM

## 2023-06-14 ENCOUNTER — Telehealth: Payer: Self-pay | Admitting: Neurosurgery

## 2023-06-14 NOTE — Telephone Encounter (Signed)
[facility] has called stating that pt's insurance is wanting to do a peer to peer for L4-5 and L5-S1 bilateral facet medial branch block. Pt is sched for procedure on 8/9   Case # [id number] Phone: [phone]  Needs to be done before procedure  CB for [facility] Joni Reining): [phone]

## 2023-06-14 NOTE — Telephone Encounter (Signed)
Anne [name] said she can do the peer to peer tomorrow at any time but would prefer in the morning between 9-11am

## 2023-06-15 ENCOUNTER — Other Ambulatory Visit: Payer: Self-pay | Admitting: Neurosurgery

## 2023-06-15 DIAGNOSIS — M47819 Spondylosis without myelopathy or radiculopathy, site unspecified: Secondary | ICD-10-CM

## 2023-06-15 NOTE — Telephone Encounter (Signed)
I will notify Joni Reining at [facility].

## 2023-06-17 ENCOUNTER — Inpatient Hospital Stay: Admission: RE | Admit: 2023-06-17 | Payer: BC Managed Care – PPO | Source: Ambulatory Visit

## 2023-06-17 ENCOUNTER — Other Ambulatory Visit: Payer: BC Managed Care – PPO

## 2023-07-28 ENCOUNTER — Other Ambulatory Visit: Payer: Self-pay | Admitting: Neurosurgery

## 2023-10-09 ENCOUNTER — Other Ambulatory Visit: Payer: Self-pay | Admitting: Dermatology

## 2024-01-12 ENCOUNTER — Other Ambulatory Visit: Payer: Self-pay | Admitting: Dermatology

## 2024-05-31 ENCOUNTER — Encounter: Payer: Self-pay | Admitting: Unknown Physician Specialty

## 2024-06-05 ENCOUNTER — Ambulatory Visit
Admission: RE | Admit: 2024-06-05 | Discharge: 2024-06-05 | Disposition: A | Discharge status: Home / Self Care | Source: Ambulatory Visit | Attending: Unknown Physician Specialty | Admitting: Unknown Physician Specialty

## 2024-06-05 ENCOUNTER — Other Ambulatory Visit: Payer: BC Managed Care – PPO

## 2024-06-05 DIAGNOSIS — E041 Nontoxic single thyroid nodule: Secondary | ICD-10-CM

## 2024-06-15 ENCOUNTER — Other Ambulatory Visit: Payer: Self-pay | Admitting: Unknown Physician Specialty

## 2024-06-15 DIAGNOSIS — E041 Nontoxic single thyroid nodule: Secondary | ICD-10-CM

## 2024-06-15 NOTE — Progress Notes (Signed)
 Patient for US  guided FNA Mid Isthmus thyroid  nodule biopsy on Mon 06/18/24, I called and LVM for the patient on the phone and gave pre-procedure instructions. VM made pt aware to be here at 12:30p and check in at the [facility] registration desk. Called 06/15/24

## 2024-06-18 ENCOUNTER — Ambulatory Visit
Admission: RE | Admit: 2024-06-18 | Discharge: 2024-06-18 | Disposition: A | Discharge status: Home / Self Care | Source: Ambulatory Visit | Attending: Unknown Physician Specialty | Admitting: Unknown Physician Specialty

## 2024-06-18 DIAGNOSIS — E041 Nontoxic single thyroid nodule: Secondary | ICD-10-CM | POA: Diagnosis present

## 2024-06-18 MED ORDER — LIDOCAINE HCL (PF) 1 % IJ SOLN
5.0000 mL | Freq: Once | INTRAMUSCULAR | Status: DC
  Filled 2024-06-18: qty 5

## 2024-06-20 LAB — CYTOLOGY - NON PAP

## 2024-07-02 ENCOUNTER — Other Ambulatory Visit: Payer: Self-pay | Admitting: Unknown Physician Specialty

## 2024-07-02 DIAGNOSIS — E041 Nontoxic single thyroid nodule: Secondary | ICD-10-CM
